# Patient Record
Sex: Female | Born: 1990 | Race: Black or African American | Hispanic: No | Marital: Single | Smoking: Former smoker
Health system: Southern US, Community
[De-identification: ages and names within clinical notes are randomized; demographics above are authoritative.]

## PROBLEM LIST (undated history)

## (undated) ENCOUNTER — Inpatient Hospital Stay (HOSPITAL_COMMUNITY): Payer: Self-pay

## (undated) DIAGNOSIS — L309 Dermatitis, unspecified: Secondary | ICD-10-CM

## (undated) HISTORY — PX: NO PAST SURGERIES: SHX2092

---
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
## 2013-03-26 ENCOUNTER — Emergency Department (HOSPITAL_BASED_OUTPATIENT_CLINIC_OR_DEPARTMENT_OTHER): Payer: 59

## 2013-03-26 ENCOUNTER — Emergency Department (HOSPITAL_BASED_OUTPATIENT_CLINIC_OR_DEPARTMENT_OTHER)
Admission: EM | Admit: 2013-03-26 | Discharge: 2013-03-27 | Disposition: A | Discharge status: Home / Self Care | Payer: 59 | Attending: Emergency Medicine | Admitting: Emergency Medicine

## 2013-03-26 ENCOUNTER — Other Ambulatory Visit: Payer: Self-pay

## 2013-03-26 ENCOUNTER — Encounter (HOSPITAL_BASED_OUTPATIENT_CLINIC_OR_DEPARTMENT_OTHER): Payer: Self-pay | Admitting: *Deleted

## 2013-03-26 DIAGNOSIS — E876 Hypokalemia: Secondary | ICD-10-CM | POA: Insufficient documentation

## 2013-03-26 DIAGNOSIS — R000 Tachycardia, unspecified: Secondary | ICD-10-CM | POA: Insufficient documentation

## 2013-03-26 DIAGNOSIS — R0602 Shortness of breath: Secondary | ICD-10-CM | POA: Insufficient documentation

## 2013-03-26 DIAGNOSIS — J40 Bronchitis, not specified as acute or chronic: Secondary | ICD-10-CM

## 2013-03-26 DIAGNOSIS — E860 Dehydration: Secondary | ICD-10-CM | POA: Insufficient documentation

## 2013-03-26 DIAGNOSIS — J3489 Other specified disorders of nose and nasal sinuses: Secondary | ICD-10-CM | POA: Insufficient documentation

## 2013-03-26 DIAGNOSIS — Z3202 Encounter for pregnancy test, result negative: Secondary | ICD-10-CM | POA: Insufficient documentation

## 2013-03-26 DIAGNOSIS — J209 Acute bronchitis, unspecified: Secondary | ICD-10-CM | POA: Insufficient documentation

## 2013-03-26 DIAGNOSIS — R509 Fever, unspecified: Secondary | ICD-10-CM | POA: Insufficient documentation

## 2013-03-26 LAB — URINALYSIS, ROUTINE W REFLEX MICROSCOPIC
Bilirubin Urine: NEGATIVE
Ketones, ur: 15 mg/dL — AB
Nitrite: POSITIVE — AB
Protein, ur: NEGATIVE mg/dL
Urobilinogen, UA: 0.2 mg/dL (ref 0.0–1.0)
pH: 5.5 (ref 5.0–8.0)

## 2013-03-26 LAB — CBC WITH DIFFERENTIAL/PLATELET
Basophils Absolute: 0 10*3/uL (ref 0.0–0.1)
Basophils Relative: 0 % (ref 0–1)
Eosinophils Absolute: 0.1 10*3/uL (ref 0.0–0.7)
HCT: 32.6 % — ABNORMAL LOW (ref 36.0–46.0)
MCH: 29.3 pg (ref 26.0–34.0)
MCHC: 33.1 g/dL (ref 30.0–36.0)
Monocytes Absolute: 0.4 10*3/uL (ref 0.1–1.0)
Neutro Abs: 15 10*3/uL — ABNORMAL HIGH (ref 1.7–7.7)
Neutrophils Relative %: 89 % — ABNORMAL HIGH (ref 43–77)
RDW: 13.1 % (ref 11.5–15.5)

## 2013-03-26 LAB — BASIC METABOLIC PANEL
BUN: 12 mg/dL (ref 6–23)
Chloride: 103 mEq/L (ref 96–112)
Creatinine, Ser: 0.8 mg/dL (ref 0.50–1.10)
GFR calc Af Amer: >90 mL/min (ref >90)
GFR calc non Af Amer: >90 mL/min (ref >90)

## 2013-03-26 LAB — PREGNANCY, URINE: Preg Test, Ur: NEGATIVE

## 2013-03-26 LAB — URINE MICROSCOPIC-ADD ON

## 2013-03-26 MED ORDER — DEXTROSE 5 % IV SOLN
500.0000 mg | Freq: Once | INTRAVENOUS | Status: AC
  Administered 2013-03-26: 500 mg via INTRAVENOUS
  Filled 2013-03-26 (×2): qty 500

## 2013-03-26 MED ORDER — AZITHROMYCIN 250 MG PO TABS: 250.0000 mg | ORAL_TABLET | Freq: Every day | ORAL | Status: DC

## 2013-03-26 MED ORDER — PREDNISONE 50 MG PO TABS
60.0000 mg | ORAL_TABLET | Freq: Once | ORAL | Status: AC
  Administered 2013-03-26: 60 mg via ORAL
  Filled 2013-03-26: qty 1

## 2013-03-26 MED ORDER — ALBUTEROL SULFATE (5 MG/ML) 0.5% IN NEBU: 5.0000 mg | INHALATION_SOLUTION | Freq: Once | RESPIRATORY_TRACT | Status: AC

## 2013-03-26 MED ORDER — IPRATROPIUM BROMIDE 0.02 % IN SOLN
RESPIRATORY_TRACT | Status: AC
  Administered 2013-03-26: 0.5 mg via RESPIRATORY_TRACT
  Filled 2013-03-26: qty 2.5

## 2013-03-26 MED ORDER — ACETAMINOPHEN 325 MG PO TABS
650.0000 mg | ORAL_TABLET | Freq: Once | ORAL | Status: AC
  Administered 2013-03-26: 650 mg via ORAL
  Filled 2013-03-26: qty 2

## 2013-03-26 MED ORDER — ALBUTEROL SULFATE HFA 108 (90 BASE) MCG/ACT IN AERS
2.0000 | INHALATION_SPRAY | RESPIRATORY_TRACT | Status: DC | PRN
  Administered 2013-03-26: 2 via RESPIRATORY_TRACT
  Filled 2013-03-26: qty 6.7

## 2013-03-26 MED ORDER — SODIUM CHLORIDE 0.9 % IV BOLUS (SEPSIS)
1000.0000 mL | Freq: Once | INTRAVENOUS | Status: AC
  Administered 2013-03-26: 1000 mL via INTRAVENOUS

## 2013-03-26 MED ORDER — ALBUTEROL SULFATE (5 MG/ML) 0.5% IN NEBU
INHALATION_SOLUTION | RESPIRATORY_TRACT | Status: AC
  Administered 2013-03-26: 5 mg via RESPIRATORY_TRACT
  Filled 2013-03-26: qty 1

## 2013-03-26 MED ORDER — ALBUTEROL SULFATE (5 MG/ML) 0.5% IN NEBU
INHALATION_SOLUTION | RESPIRATORY_TRACT | Status: AC
  Filled 2013-03-26: qty 2

## 2013-03-26 MED ORDER — ALBUTEROL (5 MG/ML) CONTINUOUS INHALATION SOLN
10.0000 mg/h | INHALATION_SOLUTION | RESPIRATORY_TRACT | Status: DC
  Administered 2013-03-26: 10 mg/h via RESPIRATORY_TRACT
  Filled 2013-03-26: qty 0.5
  Filled 2013-03-26: qty 20

## 2013-03-26 MED ORDER — IPRATROPIUM BROMIDE 0.02 % IN SOLN: 0.5000 mg | Freq: Once | RESPIRATORY_TRACT | Status: AC

## 2013-03-26 MED ORDER — HYDROCOD POLST-CHLORPHEN POLST 10-8 MG/5ML PO LQCR: 5.0000 mL | Freq: Two times a day (BID) | ORAL | Status: DC | PRN

## 2013-03-26 MED ORDER — ONDANSETRON 8 MG PO TBDP
8.0000 mg | ORAL_TABLET | Freq: Once | ORAL | Status: AC
  Administered 2013-03-26: 8 mg via ORAL
  Filled 2013-03-26: qty 1

## 2013-03-26 MED ORDER — POTASSIUM CHLORIDE CRYS ER 20 MEQ PO TBCR
40.0000 meq | EXTENDED_RELEASE_TABLET | Freq: Once | ORAL | Status: AC
  Administered 2013-03-26: 40 meq via ORAL
  Filled 2013-03-26: qty 2

## 2013-03-26 MED ORDER — PREDNISONE 20 MG PO TABS: 60.0000 mg | ORAL_TABLET | Freq: Every day | ORAL | Status: DC

## 2013-03-26 MED ORDER — POTASSIUM CHLORIDE 10 MEQ/100ML IV SOLN
10.0000 meq | INTRAVENOUS | Status: AC
  Administered 2013-03-26 – 2013-03-27 (×2): 10 meq via INTRAVENOUS
  Filled 2013-03-26 (×2): qty 100

## 2013-03-26 NOTE — ED Notes (Signed)
Pt states she has had a cough x 1 week. Hurts to take deep breath. BBS-diminished

## 2013-03-26 NOTE — ED Notes (Signed)
Patient transported to X-ray 

## 2013-03-26 NOTE — ED Notes (Signed)
RT Note: Patient was started on a continuous nebulizer with Albuterol 10 mg per MD order. Nebulizer will run over an hour. Patient is currently tolerating it well. Rt will continue to monitor.

## 2013-03-26 NOTE — ED Provider Notes (Signed)
History    This chart was scribed for Symeon Puleo B. Bernette Mayers, MD by Quintella Reichert, ED scribe.  This patient was seen in room MH09/MH09 and the patient's care was started at 6:29 PM.   CSN: [id number]  Arrival date & time 03/26/13  1742      Chief Complaint  Patient presents with  . Cough     The history is provided by the patient. No language interpreter was used.    HPI Comments: Stephanie Sosa is a 22 y.o. female who presents to the Emergency Department complaining of moderate cough that began 1 week ago.  Pt states that cough is intermittently productive and is exacerbated by deep breathing.  She also reports rhinorrhea, fever (100.3 F on admission), and SOB secondary to pain.  Pt denies nausea, emesis, urinary symptoms, diarrhea, or any other associated symptoms.  She denies known allergies or recent sick contact.  She denies h/o similar symptoms.  Pt smokes marijuana but denies cigarette use. Pt denies h/o any serious medical conditions.   History reviewed. No pertinent past medical history.  History reviewed. No pertinent past surgical history.  History reviewed. No pertinent family history.  History  Substance Use Topics  . Smoking status: Never Smoker   . Smokeless tobacco: Not on file  . Alcohol Use: No    OB History   Grav Para Term Preterm Abortions TAB SAB Ect Mult Living                  Review of Systems A complete 10 system review of systems was obtained and all systems are negative except as noted in the HPI and PMH.    Allergies  Review of patient's allergies indicates no known allergies.  Home Medications  No current outpatient prescriptions on file.  BP 156/107  Pulse 122  Temp(Src) 100.3 F (37.9 C) (Oral)  Resp 20  Ht 5' 4.5" (1.638 m)  Wt 154 lb (69.854 kg)  BMI 26.04 kg/m2  SpO2 96%  LMP 03/26/2013  Physical Exam  Nursing note and vitals reviewed. Constitutional: She is oriented to person, place, and time. She appears  well-developed and well-nourished.  HENT:  Head: Normocephalic and atraumatic.  Eyes: EOM are normal. Pupils are equal, round, and reactive to light.  Neck: Normal range of motion. Neck supple.  Cardiovascular: Normal heart sounds and intact distal pulses.   No murmur heard. Tachycardia  Pulmonary/Chest: Effort normal. No respiratory distress. She has wheezes (Mild end expiratory wheeze). She has no rales. She exhibits no tenderness.  Increased work of breathing  Abdominal: Bowel sounds are normal. She exhibits no distension. There is no tenderness.  Musculoskeletal: Normal range of motion. She exhibits no edema and no tenderness.  Neurological: She is alert and oriented to person, place, and time. She has normal strength. No cranial nerve deficit or sensory deficit.  Skin: Skin is warm and dry. No rash noted.  Psychiatric: She has a normal mood and affect.    ED Course  Procedures (including critical care time)  DIAGNOSTIC STUDIES: Oxygen Saturation is 96% on room air, normal by my interpretation.    COORDINATION OF CARE: 6:32 PM-Discussed treatment plan which includes imaging, urinalysis and Tylenol with pt at bedside and pt agreed to plan.      Labs Reviewed  CBC WITH DIFFERENTIAL - Abnormal; Notable for the following:    WBC 16.8 (*)    RBC 3.69 (*)    Hemoglobin 10.8 (*)    HCT 32.6 (*)  Platelets 140 (*)    Neutrophils Relative % 89 (*)    Neutro Abs 15.0 (*)    Lymphocytes Relative 8 (*)    Monocytes Relative 2 (*)    All other components within normal limits  BASIC METABOLIC PANEL - Abnormal; Notable for the following:    Potassium 2.5 (*)    Glucose, Bld 161 (*)    All other components within normal limits  URINALYSIS, ROUTINE W REFLEX MICROSCOPIC - Abnormal; Notable for the following:    APPearance CLOUDY (*)    Hgb urine dipstick LARGE (*)    Ketones, ur 15 (*)    Nitrite POSITIVE (*)    Leukocytes, UA SMALL (*)    All other components within normal  limits  URINE MICROSCOPIC-ADD ON - Abnormal; Notable for the following:    Squamous Epithelial / LPF FEW (*)    Bacteria, UA MANY (*)    All other components within normal limits  URINE CULTURE  PREGNANCY, URINE   Dg Chest 2 View  03/26/2013   *RADIOLOGY REPORT*  Clinical Data: Cough and upper chest pain. Fever.  CHEST - 2 VIEW  Comparison: 12/09/2010  Findings: There is slight peribronchial thickening consistent with bronchitis.  Lungs are otherwise clear.  No osseous abnormality. Heart size and vascularity are normal.  IMPRESSION: Bronchitic changes.   Original Report Authenticated By: Francene Boyers, M.D.     1. Bronchitis   2. Tachycardia   3. Hypokalemia   4. Dehydration       MDM  CXR negative for PNA. Pt with wheezing, fever, tachycardia. Minimal improvement with neb, started CAT, prednisone. Sleeping comfortably now.    10:00 PM Pt remained tachycardic after temp improved and neb worn off. No hypoxia with ambulating but tachycardia worsened. She has not been hypotensive in the ED, but due to tachycardia given IVF bolus and checked labs. HR improving with fluids, she has leukocytosis and mild anemia as well as hypokalemia. Will need repletion and likely an additional fluid bolus. She was given Zithromax for possible atypical pneumonia. Will reassess.    Date: 03/26/2013  Rate: 114  Rhythm: sinus tachycardia  QRS Axis: normal  Intervals: normal  ST/T Wave abnormalities: nonspecific T wave changes  Conduction Disutrbances:none  Narrative Interpretation:   Old EKG Reviewed: none available   12:05 AM Pt feeling much better, getting second bolus of NS and K, care signed out to Dr. Read Drivers for reassessment and ultimate disposition.   I personally performed the services described in this documentation, which was scribed in my presence. The recorded information has been reviewed and is accurate.     Jacole Capley B. Bernette Mayers, MD 03/27/13 0005

## 2013-03-26 NOTE — ED Notes (Signed)
RT at beside to admin breathing tx

## 2013-03-26 NOTE — ED Notes (Signed)
Ambulated patient. While ambulating, patient's pulse ox remained stable, but heart rate increased. MD notified.

## 2013-03-27 MED ORDER — POTASSIUM CHLORIDE CRYS ER 20 MEQ PO TBCR: 20.0000 meq | EXTENDED_RELEASE_TABLET | Freq: Every day | ORAL | Status: DC

## 2013-03-27 MED ORDER — SODIUM CHLORIDE 0.9 % IV SOLN
Freq: Once | INTRAVENOUS | Status: AC
  Administered 2013-03-27: via INTRAVENOUS

## 2013-03-27 NOTE — ED Notes (Signed)
Pt c/o nausea. Will update MD.  

## 2013-03-27 NOTE — ED Provider Notes (Signed)
1:56 AM SBP 120, HR 95 after IV fluids. Patient feeling much better.   Hanley Seamen, MD 03/27/13 0157

## 2013-03-27 NOTE — ED Notes (Signed)
Pt denies any further nausea at present. Instructed to call if this changes.

## 2013-03-27 NOTE — ED Notes (Signed)
Called to room. Pt states that medication is "burning" . Rate decreased to 75 ml/hr.

## 2013-03-27 NOTE — ED Notes (Signed)
Dr. Read Drivers aware pt is having nausea.

## 2013-03-27 NOTE — ED Notes (Signed)
Placed on cardiac monitor 

## 2013-03-27 NOTE — Progress Notes (Signed)
Albuterol MDI instruction done with patient.  Two puffs given via spacer.  Dosing frequency reviewed with patient.  Patient with good understanding of dosing and use of MDI, with return demonstration of proper technique.  Able to use on own.

## 2013-03-30 LAB — URINE CULTURE: Colony Count: >=100000

## 2013-03-31 ENCOUNTER — Telehealth (HOSPITAL_COMMUNITY): Payer: Self-pay | Admitting: Emergency Medicine

## 2013-03-31 NOTE — ED Notes (Signed)
Post ED Visit - Positive Culture Follow-up  Culture report reviewed by antimicrobial stewardship pharmacist: []  Wes Dulaney, Pharm.D., BCPS [x]  Celedonio Miyamoto, [address].D., BCPS []  Georgina Pillion, [address].D., BCPS []  [place], [address].D., BCPS, AAHIVP []  Estella Husk, Pharm.D., BCPS, AAHIV  Positive urine culture No treatment needed.  Stephanie Sosa 03/31/2013, 5:38 PM

## 2013-10-25 ENCOUNTER — Encounter (HOSPITAL_COMMUNITY): Payer: Self-pay | Admitting: Emergency Medicine

## 2013-10-25 ENCOUNTER — Emergency Department (HOSPITAL_COMMUNITY)
Admission: EM | Admit: 2013-10-25 | Discharge: 2013-10-26 | Disposition: A | Discharge status: Home / Self Care | Payer: 59 | Attending: Emergency Medicine | Admitting: Emergency Medicine

## 2013-10-25 DIAGNOSIS — N751 Abscess of Bartholin's gland: Secondary | ICD-10-CM

## 2013-10-25 DIAGNOSIS — R000 Tachycardia, unspecified: Secondary | ICD-10-CM | POA: Insufficient documentation

## 2013-10-25 DIAGNOSIS — Z3202 Encounter for pregnancy test, result negative: Secondary | ICD-10-CM | POA: Insufficient documentation

## 2013-10-25 DIAGNOSIS — F411 Generalized anxiety disorder: Secondary | ICD-10-CM | POA: Insufficient documentation

## 2013-10-25 DIAGNOSIS — IMO0002 Reserved for concepts with insufficient information to code with codable children: Secondary | ICD-10-CM | POA: Insufficient documentation

## 2013-10-25 HISTORY — DX: Dermatitis, unspecified: L30.9

## 2013-10-25 MED ORDER — OXYCODONE-ACETAMINOPHEN 5-325 MG PO TABS
2.0000 | ORAL_TABLET | Freq: Once | ORAL | Status: AC
  Administered 2013-10-25: 2 via ORAL
  Filled 2013-10-25: qty 2

## 2013-10-25 NOTE — ED Notes (Signed)
Pt c/o R labia swelling and pain. Denies drainage.

## 2013-10-26 ENCOUNTER — Encounter (HOSPITAL_COMMUNITY): Payer: Self-pay

## 2013-10-26 ENCOUNTER — Emergency Department (HOSPITAL_COMMUNITY): Payer: 59

## 2013-10-26 LAB — BASIC METABOLIC PANEL
CO2: 24 mEq/L (ref 19–32)
Chloride: 104 mEq/L (ref 96–112)
Creatinine, Ser: 0.79 mg/dL (ref 0.50–1.10)
GFR calc Af Amer: >90 mL/min (ref >90)
Glucose, Bld: 106 mg/dL — ABNORMAL HIGH (ref 70–99)
Sodium: 139 mEq/L (ref 135–145)

## 2013-10-26 LAB — CBC WITH DIFFERENTIAL/PLATELET
Basophils Relative: 0 % (ref 0–1)
Eosinophils Absolute: 0.1 10*3/uL (ref 0.0–0.7)
Eosinophils Relative: 1 % (ref 0–5)
Lymphs Abs: 1.7 10*3/uL (ref 0.7–4.0)
MCH: 28.5 pg (ref 26.0–34.0)
MCHC: 32.2 g/dL (ref 30.0–36.0)
MCV: 88.5 fL (ref 78.0–100.0)
Neutrophils Relative %: 77 % (ref 43–77)
Platelets: 159 10*3/uL (ref 150–400)
RDW: 12.8 % (ref 11.5–15.5)

## 2013-10-26 MED ORDER — IOHEXOL 300 MG/ML  SOLN
100.0000 mL | Freq: Once | INTRAMUSCULAR | Status: AC | PRN
  Administered 2013-10-26: 100 mL via INTRAVENOUS

## 2013-10-26 MED ORDER — MORPHINE SULFATE 4 MG/ML IJ SOLN
4.0000 mg | Freq: Once | INTRAMUSCULAR | Status: AC
  Administered 2013-10-26: 4 mg via INTRAVENOUS
  Filled 2013-10-26: qty 1

## 2013-10-26 MED ORDER — OXYCODONE-ACETAMINOPHEN 5-325 MG PO TABS
2.0000 | ORAL_TABLET | Freq: Once | ORAL | Status: AC
  Administered 2013-10-26: 2 via ORAL
  Filled 2013-10-26: qty 2

## 2013-10-26 MED ORDER — OXYCODONE-ACETAMINOPHEN 5-325 MG PO TABS: 1.0000 | ORAL_TABLET | ORAL | Status: DC | PRN

## 2013-10-26 NOTE — ED Provider Notes (Signed)
Medical screening examination/treatment/procedure(s) were conducted as a shared visit with non-physician practitioner(s) and myself.  I personally evaluated the patient during the encounter.  EKG Interpretation   None      I&D abscess assistance with procedure: Bedside ultrasound used to localize fluid collection for incision and drainage; physician's assistant initiated procedure and I assisted with scalpel to complete incision and drainage with copious pus expressed; patient tolerated procedure well with no apparent immediate complications, see procedure note by physician's assistant  Hurman Horn, MD 10/27/13 2250

## 2013-10-26 NOTE — ED Provider Notes (Signed)
CSN: [id number]     Arrival date & time 10/25/13  2058 History   First MD Initiated Contact with Patient 10/25/13 2302     Chief Complaint  Patient presents with  . Groin Swelling   (Consider location/radiation/quality/duration/timing/severity/associated sxs/prior Treatment) The history is provided by the patient and medical records. No language interpreter was used.   Stephanie Sosa is a 22 y.o. female  with a hx of eczema presents to the Emergency Department complaining of gradual, persistent, progressively worsening swelling of the right groin onset 3 day ago 2 day after shaving with a straight razer. Associated symptoms include pain at the site.  Nothing makes it better and walking, palpation and makes it worse.  Pt denies fever, chills, headache, neck pain, chest pain, abd pain, N/V/D, vaginal bleeding, vaginal discharge.     Past Medical History  Diagnosis Date  . Eczema    History reviewed. No pertinent past surgical history. No family history on file. History  Substance Use Topics  . Smoking status: Never Smoker   . Smokeless tobacco: Not on file  . Alcohol Use: No   OB History   Grav Para Term Preterm Abortions TAB SAB Ect Mult Living                 Review of Systems  Constitutional: Negative for fever and chills.  Gastrointestinal: Negative for nausea and vomiting.  Skin: Positive for rash.  Allergic/Immunologic: Negative for immunocompromised state.  Hematological: Does not bruise/bleed easily.  Psychiatric/Behavioral: The patient is not nervous/anxious.     Allergies  Review of patient's allergies indicates no known allergies.  Home Medications   Current Outpatient Rx  Name  Route  Sig  Dispense  Refill  . triamcinolone cream (KENALOG) 0.1 %   Topical   Apply 1 application topically 2 (two) times daily.          BP 138/91  Pulse 99  Temp(Src) 98.5 F (36.9 C) (Oral)  Resp 16  Ht 5\' 4"  (1.626 m)  Wt 148 lb (67.132 kg)  BMI 25.39 kg/m2  SpO2  98%  LMP 10/14/2013 Physical Exam  Nursing note and vitals reviewed. Constitutional: She is oriented to person, place, and time. She appears well-developed and well-nourished. No distress.  HENT:  Head: Normocephalic and atraumatic.  Eyes: Conjunctivae are normal. No scleral icterus.  Neck: Normal range of motion.  Cardiovascular: Regular rhythm, normal heart sounds and intact distal pulses.   tachycardia  Pulmonary/Chest: Effort normal and breath sounds normal.  Abdominal: Soft. Bowel sounds are normal. She exhibits no distension. There is no tenderness. Hernia confirmed negative in the right inguinal area and confirmed negative in the left inguinal area.  Soft and nontender  Genitourinary:    There is no rash, tenderness, lesion or injury on the right labia. There is no rash, tenderness, lesion or injury on the left labia. No erythema, tenderness or bleeding around the vagina. No foreign body around the vagina. No signs of injury around the vagina. No vaginal discharge found.  No evidence of a bartholin gland cyst  Lymphadenopathy:    She has no cervical adenopathy.       Right: No inguinal adenopathy present.       Left: No inguinal adenopathy present.  Neurological: She is alert and oriented to person, place, and time.  Skin: Skin is warm and dry. She is not diaphoretic. There is erythema.  Psychiatric: Her mood appears anxious.  Pt is tearful and anxious  ED Course  Procedures (including critical care time) Labs Review Labs Reviewed  CBC WITH DIFFERENTIAL  BASIC METABOLIC PANEL  POCT PREGNANCY, URINE   Imaging Review No results found.  EKG Interpretation   None      EMERGENCY DEPARTMENT US SOFT TISSUE INTERPRETATION "Study: Limited Ultrasound of the noted body part in comments below"  INDICATIONS: Pain and Soft tissue infection Multiple views of the body part are obtained with a multi-frequency linear probe  PERFORMED BY:  Myself  IMAGES ARCHIVED?:  Yes  SIDE:Right   BODY PART: right lower labia/buttock  FINDINGS: Abcess present  LIMITATIONS:  none  INTERPRETATION:  Abcess present  COMMENT:  Large, deep fluid collection  MDM   1. Perianal abscess      Stephanie Sosa presents with "lump in groin."  On exam, palpable fluctuance below the surface with only minimal induration and very little erythema.  I evaluated the area with an ultrasound which shows a large collection of fluid, likely to be an abscess.  Will plan for I&D.    1:21 AM On repeat exam induration tracts all the way to the perineum and his perirectal.  I do not feel comfortable with I&D in this area without further imaging. CBC and BMP pending. Patient pain control at this time.  Urine pregnancy test negative. Will obtain CT pelvis with contrast.  Discussed with Antony Madura, PA-C who will assume care.  Stephanie Client Paytyn Mesta, PA-C 10/26/13 [phone]

## 2013-10-26 NOTE — ED Provider Notes (Signed)
Patient care assumed from [facility], PA-C at shift change. Patient presenting today with worsening R groin pain x 3 days. CT pending to evaluate for perirectal abscess.  CT negative for perirectal abscess. Findings are significant for 2.6cm bartholin's abscess. Morphine ordered for pain control. Plan to I&D today.  I&D at beside with copious amounts of purulent drainage. Patient tolerated procedure well. Patient stable for d/c with OBGYN follow up for recheck in 48 hours. Return precautions discussed and patient agreeable to plan with no unaddressed concerns.   INCISION AND DRAINAGE Performed by: Antony Madura Consent: Verbal consent obtained. Risks and benefits: risks, benefits and alternatives were discussed Type: abscess  Body area: R genital/bartholin's gland  Anesthesia: local infiltration  Incision was made with a scalpel.  Local anesthetic: lidocaine 2% with epinephrine  Anesthetic total: 8 ml  Complexity: complex Blunt dissection to break up loculations  Drainage: purulent and bloody  Drainage amount: copious  Packing material: none  Patient tolerance: Patient tolerated the procedure well with no immediate complications.   Results for orders placed during the hospital encounter of 10/25/13  CBC WITH DIFFERENTIAL      Result Value Range   WBC 11.7 (*) 4.0 - 10.5 K/uL   RBC 3.82 (*) 3.87 - 5.11 MIL/uL   Hemoglobin 10.9 (*) 12.0 - 15.0 g/dL   HCT 16.1 (*) 09.6 - 04.5 %   MCV 88.5  78.0 - 100.0 fL   MCH 28.5  26.0 - 34.0 pg   MCHC 32.2  30.0 - 36.0 g/dL   RDW 40.9  81.1 - 91.4 %   Platelets 159  150 - 400 K/uL   Neutrophils Relative % 77  43 - 77 %   Neutro Abs 9.0 (*) 1.7 - 7.7 K/uL   Lymphocytes Relative 15  12 - 46 %   Lymphs Abs 1.7  0.7 - 4.0 K/uL   Monocytes Relative 8  3 - 12 %   Monocytes Absolute 0.9  0.1 - 1.0 K/uL   Eosinophils Relative 1  0 - 5 %   Eosinophils Absolute 0.1  0.0 - 0.7 K/uL   Basophils Relative 0  0 - 1 %   Basophils  Absolute 0.0  0.0 - 0.1 K/uL  BASIC METABOLIC PANEL      Result Value Range   Sodium 139  135 - 145 mEq/L   Potassium 3.5  3.5 - 5.1 mEq/L   Chloride 104  96 - 112 mEq/L   CO2 24  19 - 32 mEq/L   Glucose, Bld 106 (*) 70 - 99 mg/dL   BUN 8  6 - 23 mg/dL   Creatinine, Ser 7.82  0.50 - 1.10 mg/dL   Calcium 9.1  8.4 - 95.6 mg/dL   GFR calc non Af Amer >90  >90 mL/min   GFR calc Af Amer >90  >90 mL/min  POCT PREGNANCY, URINE      Result Value Range   Preg Test, Ur NEGATIVE  NEGATIVE   Ct Pelvis W Contrast  10/26/2013   CLINICAL DATA:  Right groin and labial swelling  EXAM: CT PELVIS WITH CONTRAST  TECHNIQUE: Multidetector CT imaging of the pelvis was performed using the standard protocol following the bolus administration of intravenous contrast.  CONTRAST:  OMNIPAQUE IOHEXOL 300 MG/ML  SOLN  COMPARISON:  None.  FINDINGS: In the right vulvar region, there is a 2.7 cm rounded collection with avid peripheral enhancement. No involvement above the pelvic floor.  No acute intra-abdominal findings. Likely small  volume free pelvic fluid. A thin, tubular fluid-filled structure in the left adnexa region is favored to represent bowel over hydrosalpinx. No acute osseous findings.  IMPRESSION: 2.6 cm right Bartholin's cyst, sterility indeterminate.   Electronically Signed   By: Tiburcio Pea M.D.   On: 10/26/2013 02:35      Antony Madura, PA-C 10/26/13 0424

## 2013-11-04 ENCOUNTER — Encounter (HOSPITAL_COMMUNITY): Payer: Self-pay | Admitting: Emergency Medicine

## 2013-11-04 ENCOUNTER — Emergency Department (HOSPITAL_COMMUNITY)
Admission: EM | Admit: 2013-11-04 | Discharge: 2013-11-04 | Disposition: A | Discharge status: Home / Self Care | Payer: 59 | Attending: Emergency Medicine | Admitting: Emergency Medicine

## 2013-11-04 DIAGNOSIS — N764 Abscess of vulva: Secondary | ICD-10-CM | POA: Insufficient documentation

## 2013-11-04 DIAGNOSIS — Z79899 Other long term (current) drug therapy: Secondary | ICD-10-CM | POA: Insufficient documentation

## 2013-11-04 DIAGNOSIS — L259 Unspecified contact dermatitis, unspecified cause: Secondary | ICD-10-CM | POA: Insufficient documentation

## 2013-11-04 MED ORDER — HYDROCODONE-ACETAMINOPHEN 5-325 MG PO TABS: 1.0000 | ORAL_TABLET | ORAL | Status: DC | PRN

## 2013-11-04 MED ORDER — OXYCODONE-ACETAMINOPHEN 5-325 MG PO TABS
1.0000 | ORAL_TABLET | Freq: Once | ORAL | Status: AC
  Administered 2013-11-04: 1 via ORAL
  Filled 2013-11-04: qty 1

## 2013-11-04 MED ORDER — IBUPROFEN 800 MG PO TABS
800.0000 mg | ORAL_TABLET | Freq: Once | ORAL | Status: AC
  Administered 2013-11-04: 800 mg via ORAL
  Filled 2013-11-04: qty 1

## 2013-11-04 NOTE — ED Provider Notes (Signed)
CSN: [id number]     Arrival date & time 11/04/13  0941 History   First MD Initiated Contact with Patient 11/04/13 1120     Chief Complaint  Patient presents with  . Abscess    HPI  Patient reports recurrence of her right labial abscess.  She had incision and drainage performed in the emergency department approximately one week ago.  She's been on antibiotics and doing warm water soaks but states that initially it improved and now it is worsening again.  She does not have an OB/GYN.  No fevers or chills.  Her pain is moderate to severe in severity   Past Medical History  Diagnosis Date  . Eczema    History reviewed. No pertinent past surgical history. History reviewed. No pertinent family history. History  Substance Use Topics  . Smoking status: Never Smoker   . Smokeless tobacco: Not on file  . Alcohol Use: No   OB History   Grav Para Term Preterm Abortions TAB SAB Ect Mult Living                 Review of Systems  All other systems reviewed and are negative.    Allergies  Banana  Home Medications   Current Outpatient Rx  Name  Route  Sig  Dispense  Refill  . doxycycline (ADOXA) 100 MG tablet   Oral   Take 100 mg by mouth 2 (two) times daily.         Marland Kitchen oxyCODONE-acetaminophen (PERCOCET/ROXICET) 5-325 MG per tablet   Oral   Take 1 tablet by mouth every 4 (four) hours as needed for severe pain.   7 tablet   0   . triamcinolone cream (KENALOG) 0.1 %   Topical   Apply 1 application topically 2 (two) times daily.          BP 118/75  Pulse 112  Temp(Src) 98.2 F (36.8 C) (Oral)  Resp 20  SpO2 99%  LMP 10/14/2013 Physical Exam  Nursing note and vitals reviewed. Constitutional: She is oriented to person, place, and time. She appears well-developed and well-nourished. No distress.  HENT:  Head: Normocephalic and atraumatic.  Eyes: EOM are normal.  Neck: Normal range of motion.  Cardiovascular: Normal rate, regular rhythm and normal heart sounds.    Pulmonary/Chest: Effort normal and breath sounds normal.  Abdominal: Soft. She exhibits no distension. There is no tenderness.  Genitourinary:  Large abscess of the right labia minora likely Bartholin ds gland abscess.  Musculoskeletal: Normal range of motion.  Neurological: She is alert and oriented to person, place, and time.  Skin: Skin is warm and dry.  Psychiatric: She has a normal mood and affect. Judgment normal.    ED Course  Procedures (including critical care time)  INCISION AND DRAINAGE Performed by: Lyanne Co Consent: Verbal consent obtained. Risks and benefits: risks, benefits and alternatives were discussed Time out performed prior to procedure Type: abscess Body area: right inferior labia minora Anesthesia: local infiltration Incision was made with a scalpel. Local anesthetic: lidocaine 2% with epinephrine Anesthetic total: 7 ml Complexity: complex Blunt dissection to break up loculations Drainage: purulent Drainage amount: large Packing material: none Patient tolerance: Patient tolerated the procedure well with no immediate complications.     Labs Review Labs Reviewed - No data to display Imaging Review No results found.  EKG Interpretation   None       MDM   1. Labial abscess    Large right labial abscess likely Bartholin's  gland abscess.  Patient will continue her doxycycline.  Continue warm water soaks.  Return to the maternity assessment unit at [facility] hospital for worsening symptoms.  The patient barely tolerated the procedure likely secondary to anxiety and pain. I felt as though an appropriate incision and drainage was performed and I do not think that she needs additional care at this time.  Have instructed her that if things were to worsen she would likely need to be taken to the operating room and placed under general anesthesia given her severe reaction to pain and her level of anxiety    Lyanne Co, MD 11/04/13 1240

## 2013-11-04 NOTE — ED Notes (Signed)
Pt here week ago last Thursday for vaginal abscess, abscess was drained, now has closed and fluid collected again, states labia swollen and painful

## 2013-11-04 NOTE — ED Notes (Signed)
Patient was educated not to drive, operate heavy machinery, or drink alcohol while taking narcotic medication.  

## 2013-11-04 NOTE — ED Notes (Signed)
Bed: WA12 Expected date:  Expected time:  Means of arrival:  Comments: 

## 2015-04-06 ENCOUNTER — Emergency Department (HOSPITAL_COMMUNITY)
Admission: EM | Admit: 2015-04-06 | Discharge: 2015-04-06 | Disposition: A | Discharge status: Home / Self Care | Payer: 59 | Attending: Emergency Medicine | Admitting: Emergency Medicine

## 2015-04-06 ENCOUNTER — Encounter (HOSPITAL_COMMUNITY): Payer: Self-pay | Admitting: Emergency Medicine

## 2015-04-06 DIAGNOSIS — H18821 Corneal disorder due to contact lens, right eye: Secondary | ICD-10-CM

## 2015-04-06 DIAGNOSIS — Z872 Personal history of diseases of the skin and subcutaneous tissue: Secondary | ICD-10-CM | POA: Diagnosis not present

## 2015-04-06 DIAGNOSIS — Z79899 Other long term (current) drug therapy: Secondary | ICD-10-CM | POA: Diagnosis not present

## 2015-04-06 DIAGNOSIS — H578 Other specified disorders of eye and adnexa: Secondary | ICD-10-CM | POA: Diagnosis present

## 2015-04-06 MED ORDER — FLUORESCEIN SODIUM 1 MG OP STRP
2.0000 | ORAL_STRIP | Freq: Once | OPHTHALMIC | Status: DC
  Filled 2015-04-06: qty 2

## 2015-04-06 MED ORDER — CIPROFLOXACIN HCL 0.3 % OP SOLN: 1.0000 [drp] | OPHTHALMIC | Status: DC

## 2015-04-06 MED ORDER — TETRACAINE HCL 0.5 % OP SOLN
2.0000 [drp] | Freq: Once | OPHTHALMIC | Status: DC
  Filled 2015-04-06: qty 2

## 2015-04-06 NOTE — ED Provider Notes (Signed)
CSN: [id number]     Arrival date & time 04/06/15  2043 History   First MD Initiated Contact with Patient 04/06/15 2145     Chief Complaint  Patient presents with  . Eye Irritation      (Consider location/radiation/quality/duration/timing/severity/associated sxs/prior Treatment) The history is provided by the patient and medical records. No language interpreter was used.     Stephanie Sosa is a 24 y.o. female  with a hx of eczema presents to the Emergency Department complaining of gradual, persistent, progressively worsening right eye redness and feeling of foreign body onset 3 days ago.  Pt reports she wears monthly contacts and changed them 1 week ago.  Pt reports she has used saline eye drops without relief.  She denies watery eye. Associated symptoms include irritation of the right eye.  She denies purulent discharge or matting of the eye in the AM.  Saline eye drops makes it better and wearing her contact lenses makes it worse.  Pt denies fever, chills, headache, visual acuity, neck pain, photophobia, purulent drainage.       Past Medical History  Diagnosis Date  . Eczema    History reviewed. No pertinent past surgical history. No family history on file. History  Substance Use Topics  . Smoking status: Never Smoker   . Smokeless tobacco: Not on file  . Alcohol Use: No   OB History    No data available     Review of Systems  Constitutional: Negative for fever, chills and fatigue.  HENT: Negative for congestion, ear discharge, ear pain, mouth sores, postnasal drip, rhinorrhea, sinus pressure and sore throat.   Eyes: Positive for redness. Negative for visual disturbance.  Respiratory: Negative for cough.   Cardiovascular: Negative for chest pain, palpitations and leg swelling.  Gastrointestinal: Negative for nausea and vomiting.  Musculoskeletal: Negative for arthralgias and neck stiffness.  Skin: Negative for rash.  Neurological: Negative for syncope, light-headedness and  headaches.  Hematological: Negative for adenopathy.  All other systems reviewed and are negative.     Allergies  Banana  Home Medications   Prior to Admission medications   Medication Sig Start Date End Date Taking? Authorizing Provider  acetaminophen (TYLENOL) 500 MG tablet Take 1,000 mg by mouth every 6 (six) hours as needed for mild pain or moderate pain.   Yes Historical Provider, MD  ibuprofen (ADVIL,MOTRIN) 200 MG tablet Take 400 mg by mouth every 6 (six) hours as needed for mild pain, moderate pain or cramping.   Yes Historical Provider, MD  naproxen sodium (ANAPROX) 220 MG tablet Take 440 mg by mouth every 12 (twelve) hours as needed (cramping).   Yes Historical Provider, MD  ciprofloxacin (CILOXAN) 0.3 % ophthalmic solution Place 1 drop into both eyes every 2 (two) hours. Administer 1 drop, every 2 hours, while awake, for 2 days. Then 1 drop, every 4 hours, while awake, for the next 5 days. 04/06/15   Tzvi Economou, PA-C  HYDROcodone-acetaminophen (NORCO/VICODIN) 5-325 MG per tablet Take 1 tablet by mouth every 4 (four) hours as needed for moderate pain. Patient not taking: Reported on 04/06/2015 11/04/13   Azalia BilisKevin Campos, MD  oxyCODONE-acetaminophen (PERCOCET/ROXICET) 5-325 MG per tablet Take 1 tablet by mouth every 4 (four) hours as needed for severe pain. Patient not taking: Reported on 04/06/2015 10/26/13   Antony MaduraKelly Humes, PA-C   BP 143/92 mmHg  Pulse 96  Temp(Src)   Resp 20  SpO2 100% Physical Exam  Constitutional: She is oriented to person, place, and time.  She appears well-developed and well-nourished. No distress.  HENT:  Head: Normocephalic and atraumatic.  Nose: Nose normal. No mucosal edema or rhinorrhea.  Mouth/Throat: Uvula is midline, oropharynx is clear and moist and mucous membranes are normal. No uvula swelling. No oropharyngeal exudate, posterior oropharyngeal edema, posterior oropharyngeal erythema or tonsillar abscesses.  Eyes: Conjunctivae, EOM and lids  are normal. Pupils are equal, round, and reactive to light. Lids are everted and swept, no foreign bodies found. Right eye exhibits no chemosis, no discharge and no exudate. No foreign body present in the right eye. Left eye exhibits no chemosis, no discharge and no exudate. No foreign body present in the left eye. Right conjunctiva is not injected. Right conjunctiva has no hemorrhage. Left conjunctiva is not injected. Left conjunctiva has no hemorrhage.  Slit lamp exam:      The right eye shows no corneal abrasion, no corneal flare, no corneal ulcer, no foreign body, no fluorescein uptake and no anterior chamber bulge.       The left eye shows no corneal abrasion, no corneal flare, no corneal ulcer, no foreign body, no fluorescein uptake and no anterior chamber bulge.    Pupils equal round and reactive to light No vertical, horizontal or rotational nystagmus Corneal abrasion noted to the right eye ringing the iris where her contact lens would fall with fluorescein uptake No visible foreign body No corneal flare, ulcer or dendritic staining  No herpetic lesions to the face or around the eye  Visual Acuity (pt not wearing visual correction):  R Near: 20/100;  L Near: 20/100  Neck: Normal range of motion.  Full range of motion without pain No midline or paraspinal tenderness No nuchal rigidity; no meningeal signs  Cardiovascular: Normal rate, regular rhythm, normal heart sounds and intact distal pulses.   No murmur heard. Pulmonary/Chest: Effort normal. No respiratory distress.  Musculoskeletal: Normal range of motion.  Neurological: She is alert and oriented to person, place, and time.  Mental Status:  Alert, oriented, thought content appropriate. Speech fluent without evidence of aphasia. Able to follow 2 step commands without difficulty.   Cranial Nerves:  II:  Peripheral visual fields grossly normal, pupils equal, round, reactive to light III,IV, VI: ptosis not present, extra-ocular  motions intact bilaterally  V,VII: smile symmetric, facial light touch sensation equal VIII: hearing grossly normal bilaterally  IX,X: gag reflex present  XI: bilateral shoulder shrug equal and strong XII: midline tongue extension   Skin: Skin is warm and dry. She is not diaphoretic. No erythema.  Psychiatric: She has a normal mood and affect.  Nursing note and vitals reviewed.   ED Course  Procedures (including critical care time) Labs Review Labs Reviewed - No data to display  Imaging Review No results found.   EKG Interpretation None      MDM   Final diagnoses:  Corneal abrasion due to contact lens, right   Stephanie Bellini presents with corneal abrasion on PE.  Eye irrigated w NS, no evidence of FB.  No change in vision, acuity equal bilaterally.  Pt is a contact lens wearer.  Exam non-concerning for orbital cellulitis, hyphema, corneal ulcers. Patient will be discharged home with cipro drops.   Patient understands to follow up with ophthalmology ASAP, & to return to ER if new symptoms develop including change in vision, purulent drainage, or entrapment.  BP 143/92 mmHg  Pulse 96  Temp(Src)   Resp 20  SpO2 100%    Dierdre Forth, PA-C 04/06/15 2314  Dorie Rank, MD 04/10/15 1440

## 2015-04-06 NOTE — ED Notes (Signed)
Pt states she has redness and irritation to her R eye. Pt has used some eye drops with no relief. Pt states today eye started to swell. Pt states she has been wearing her contacts and symptoms get worse when she takes her contacts out. Denies itching or draining from eye. Has hx of seasonal allergies.

## 2015-04-06 NOTE — Discharge Instructions (Signed)
1. Medications: cipro ophthalmologic, usual home medications 2. Treatment: rest, drink plenty of fluids, use saline eye drops for comfort 3. Follow Up: Please followup with your ophthalmologist in 2-3 days for discussion of your diagnoses and further evaluation after today's visit; if you do not have a primary care doctor use the resource guide provided to find one; Please return to the ER for worsening symptoms, fever or other concerns    Corneal Abrasion The cornea is the clear covering at the front and center of the eye. When looking at the colored portion of the eye (iris), you are looking through the cornea. This very thin tissue is made up of many layers. The surface layer is a single layer of cells (corneal epithelium) and is one of the most sensitive tissues in the body. If a scratch or injury causes the corneal epithelium to come off, it is called a corneal abrasion. If the injury extends to the tissues below the epithelium, the condition is called a corneal ulcer. CAUSES   Scratches.  Trauma.  Foreign body in the eye. Some people have recurrences of abrasions in the area of the original injury even after it has healed (recurrent erosion syndrome). Recurrent erosion syndrome generally improves and goes away with time. SYMPTOMS   Eye pain.  Difficulty or inability to keep the injured eye open.  The eye becomes very sensitive to light.  Recurrent erosions tend to happen suddenly, first thing in the morning, usually after waking up and opening the eye. DIAGNOSIS  Your health care provider can diagnose a corneal abrasion during an eye exam. Dye is usually placed in the eye using a drop or a small paper strip moistened by your tears. When the eye is examined with a special light, the abrasion shows up clearly because of the dye. TREATMENT   Small abrasions may be treated with antibiotic drops or ointment alone.  A pressure patch may be put over the eye. If this is done, follow your  doctor's instructions for when to remove the patch. Do not drive or use machines while the eye patch is on. Judging distances is hard to do with a patch on. If the abrasion becomes infected and spreads to the deeper tissues of the cornea, a corneal ulcer can result. This is serious because it can cause corneal scarring. Corneal scars interfere with light passing through the cornea and cause a loss of vision in the involved eye. HOME CARE INSTRUCTIONS  Use medicine or ointment as directed. Only take over-the-counter or prescription medicines for pain, discomfort, or fever as directed by your health care provider.  Do not drive or operate machinery if your eye is patched. Your ability to judge distances is impaired.  If your health care provider has given you a follow-up appointment, it is very important to keep that appointment. Not keeping the appointment could result in a severe eye infection or permanent loss of vision. If there is any problem keeping the appointment, let your health care provider know. SEEK MEDICAL CARE IF:   You have pain, light sensitivity, and a scratchy feeling in one eye or both eyes.  Your pressure patch keeps loosening up, and you can blink your eye under the patch after treatment.  Any kind of discharge develops from the eye after treatment or if the lids stick together in the morning.  You have the same symptoms in the morning as you did with the original abrasion days, weeks, or months after the abrasion healed. MAKE  SURE YOU:   Understand these instructions.  Will watch your condition.  Will get help right away if you are not doing well or get worse. Document Released: 10/23/2000 Document Revised: 10/31/2013 Document Reviewed: 07/03/2013 [facility] Patient Information 2015 [place], [place]. This information is not intended to replace advice given to you by your health care provider. Make sure you discuss any questions you have with your health care provider.

## 2015-08-04 ENCOUNTER — Emergency Department (HOSPITAL_COMMUNITY)
Admission: EM | Admit: 2015-08-04 | Discharge: 2015-08-04 | Disposition: A | Discharge status: Home / Self Care | Payer: 59 | Attending: Emergency Medicine | Admitting: Emergency Medicine

## 2015-08-04 ENCOUNTER — Encounter (HOSPITAL_COMMUNITY): Payer: Self-pay | Admitting: Emergency Medicine

## 2015-08-04 DIAGNOSIS — J029 Acute pharyngitis, unspecified: Secondary | ICD-10-CM | POA: Insufficient documentation

## 2015-08-04 DIAGNOSIS — Z872 Personal history of diseases of the skin and subcutaneous tissue: Secondary | ICD-10-CM | POA: Insufficient documentation

## 2015-08-04 LAB — RAPID STREP SCREEN (MED CTR MEBANE ONLY): Streptococcus, Group A Screen (Direct): NEGATIVE

## 2015-08-04 MED ORDER — PENICILLIN G BENZATHINE 1200000 UNIT/2ML IM SUSP
1.2000 10*6.[IU] | Freq: Once | INTRAMUSCULAR | Status: AC
  Administered 2015-08-04: 1.2 10*6.[IU] via INTRAMUSCULAR
  Filled 2015-08-04: qty 2

## 2015-08-04 MED ORDER — IBUPROFEN 400 MG PO TABS
800.0000 mg | ORAL_TABLET | Freq: Once | ORAL | Status: AC
  Administered 2015-08-04: 800 mg via ORAL
  Filled 2015-08-04: qty 2

## 2015-08-04 MED ORDER — LIDOCAINE VISCOUS 2 % MT SOLN: 20.0000 mL | OROMUCOSAL | Status: DC | PRN

## 2015-08-04 MED ORDER — DEXAMETHASONE SODIUM PHOSPHATE 10 MG/ML IJ SOLN
20.0000 mg | Freq: Once | INTRAMUSCULAR | Status: AC
  Administered 2015-08-04: 20 mg via INTRAMUSCULAR
  Filled 2015-08-04: qty 2

## 2015-08-04 MED ORDER — LIDOCAINE VISCOUS 2 % MT SOLN
15.0000 mL | Freq: Once | OROMUCOSAL | Status: AC
  Administered 2015-08-04: 15 mL via OROMUCOSAL
  Filled 2015-08-04: qty 15

## 2015-08-04 NOTE — ED Notes (Signed)
Pt. Stated, I've had a sore troat for 3 days, I can't even eat.

## 2015-08-04 NOTE — Discharge Instructions (Signed)
Pharyngitis Return for difficulty breathing or tolerating fluids. Follow-up with her primary care physician. Pharyngitis is a sore throat (pharynx). There is redness, pain, and swelling of your throat. HOME CARE   Drink enough fluids to keep your pee (urine) clear or pale yellow.  Only take medicine as told by your doctor.  You may get sick again if you do not take medicine as told. Finish your medicines, even if you start to feel better.  Do not take aspirin.  Rest.  Rinse your mouth (gargle) with salt water ( tsp of salt per 1 qt of water) every 1-2 hours. This will help the pain.  If you are not at risk for choking, you can suck on hard candy or sore throat lozenges. GET HELP IF:  You have large, tender lumps on your neck.  You have a rash.  You cough up green, yellow-brown, or bloody spit. GET HELP RIGHT AWAY IF:   You have a stiff neck.  You drool or cannot swallow liquids.  You throw up (vomit) or are not able to keep medicine or liquids down.  You have very bad pain that does not go away with medicine.  You have problems breathing (not from a stuffy nose). MAKE SURE YOU:   Understand these instructions.  Will watch your condition.  Will get help right away if you are not doing well or get worse. Document Released: 04/13/2008 Document Revised: 08/16/2013 Document Reviewed: 07/03/2013 [facility] Patient Information 2015 [place], [place]. This information is not intended to replace advice given to you by your health care provider. Make sure you discuss any questions you have with your health care provider.

## 2015-08-04 NOTE — ED Notes (Signed)
PT refuses to stay for 30 mins post antibx injection.

## 2015-08-04 NOTE — ED Provider Notes (Signed)
CSN: [id number]     Arrival date & time 08/04/15  1335 History  This chart was scribed for Catha Gosselin, PA-C, working with Marily Memos, MD by Octavia Heir, ED Scribe. This patient was seen in room TR06C/TR06C and the patient's care was started at 2:23 PM.     Chief Complaint  Patient presents with  . Sore Throat      The history is provided by the patient. No language interpreter was used.   HPI Comments: Stephanie Sosa is a 24 y.o. female who presents to the Emergency Department complaining of constant, gradual worsening sore throat onset 3 days ago. Pt has difficulty swallowing and states that her legs have been achy. She has not taken any medication to alleviate the pain but states she has tried using natural remedies such as chewing ginger and apple cider vinegar. Pt denies fever or cough.  Past Medical History  Diagnosis Date  . Eczema    History reviewed. No pertinent past surgical history. No family history on file. Social History  Substance Use Topics  . Smoking status: Never Smoker   . Smokeless tobacco: None  . Alcohol Use: No   OB History    No data available     Review of Systems  Constitutional: Negative for fever.  HENT: Positive for sore throat and trouble swallowing. Negative for drooling.   Respiratory: Negative for cough and shortness of breath.       Allergies  Banana  Home Medications   Prior to Admission medications   Medication Sig Start Date End Date Taking? Authorizing Provider  acetaminophen (TYLENOL) 500 MG tablet Take 1,000 mg by mouth every 6 (six) hours as needed for mild pain or moderate pain.    Historical Provider, MD  ciprofloxacin (CILOXAN) 0.3 % ophthalmic solution Place 1 drop into both eyes every 2 (two) hours. Administer 1 drop, every 2 hours, while awake, for 2 days. Then 1 drop, every 4 hours, while awake, for the next 5 days. 04/06/15   Hannah Muthersbaugh, PA-C  HYDROcodone-acetaminophen (NORCO/VICODIN) 5-325 MG per  tablet Take 1 tablet by mouth every 4 (four) hours as needed for moderate pain. Patient not taking: Reported on 04/06/2015 11/04/13   Azalia Bilis, MD  ibuprofen (ADVIL,MOTRIN) 200 MG tablet Take 400 mg by mouth every 6 (six) hours as needed for mild pain, moderate pain or cramping.    Historical Provider, MD  lidocaine (XYLOCAINE) 2 % solution Use as directed 20 mLs in the mouth or throat as needed for mouth pain. 08/04/15   Hanna Patel-Mills, PA-C  naproxen sodium (ANAPROX) 220 MG tablet Take 440 mg by mouth every 12 (twelve) hours as needed (cramping).    Historical Provider, MD  oxyCODONE-acetaminophen (PERCOCET/ROXICET) 5-325 MG per tablet Take 1 tablet by mouth every 4 (four) hours as needed for severe pain. Patient not taking: Reported on 04/06/2015 10/26/13   Antony Madura, PA-C   Triage vitals: BP 112/76 mmHg  Pulse 86  Temp(Src) 98 F (36.7 C)  Resp 16  Ht 5' 4.5" (1.638 m)  Wt 134 lb (60.782 kg)  BMI 22.65 kg/m2  SpO2 99%  LMP 07/21/2015 Physical Exam  Constitutional: She appears well-developed and well-nourished. No distress.  HENT:  Head: Normocephalic and atraumatic.  she has edema and erythema to bilateral tonsils, she also has left sided anterior cervical lymphadenopathy with tenderness but no midline swelling or fluctuance. Patent airway, no hot potato voice, no drooling or trismus, no kissing tonsils, no tonsillar or oropharyngeal exudates,  Eyes: Right eye exhibits no discharge. Left eye exhibits no discharge.  Pulmonary/Chest: Effort normal. No respiratory distress.  Neurological: She is alert. Coordination normal.  Skin: No rash noted. She is not diaphoretic.  Psychiatric: She has a normal mood and affect. Her behavior is normal.  Nursing note and vitals reviewed.   ED Course  Procedures  DIAGNOSTIC STUDIES: Oxygen Saturation is 99% on RA, normal by my interpretation.  COORDINATION OF CARE:  2:26 PM Discussed treatment plan with pt at bedside and pt agreed to  plan.  Labs Review Labs Reviewed  RAPID STREP SCREEN (NOT AT [facility])  CULTURE, GROUP A STREP    Imaging Review No results found.  EKG Interpretation None      MDM   Final diagnoses:  Pharyngitis  Patient presents for sore throat. I reviewed this [facility] criteria and believed that she may benefit from anabiotic's. She is tolerating by mouth fluids. I discussed return precautions with the patient as well as follow-up and she verbally agrees with the plan. Filed Vitals:   08/04/15 1347  BP: 112/76  Pulse: 86  Temp: 98 F (36.7 C)  Resp: 16   Medications  dexamethasone (DECADRON) injection 20 mg (20 mg Intramuscular Given 08/04/15 1456)  lidocaine (XYLOCAINE) 2 % viscous mouth solution 15 mL (15 mLs Mouth/Throat Given 08/04/15 1456)  penicillin g benzathine (BICILLIN LA) 1200000 UNIT/2ML injection 1.2 Million Units (1.2 Million Units Intramuscular Given 08/04/15 1532)   I personally performed the services described in this documentation, which was scribed in my presence. The recorded information has been reviewed and is accurate.   Catha Gosselin, PA-C 08/04/15 1543  Marily Memos, MD 08/05/15 [phone]

## 2015-08-06 LAB — CULTURE, GROUP A STREP: Strep A Culture: POSITIVE — AB

## 2015-08-07 ENCOUNTER — Telehealth (HOSPITAL_BASED_OUTPATIENT_CLINIC_OR_DEPARTMENT_OTHER): Payer: Self-pay | Admitting: Emergency Medicine

## 2015-08-07 NOTE — Telephone Encounter (Signed)
Post ED Visit - Positive Culture Follow-up  Culture report reviewed by antimicrobial stewardship pharmacist:   Celedonio Miyamoto, Pharm.D., BCPS  Georgina Pillion, Pharm.D., BCPS  [place], [address].D., BCPS, AAHIVP  Estella Husk, Pharm.D., BCPS, AAHIVP  [place], [address].D.  Cassie Roseanne Reno, [place].D.  Positive strep Treated with PCN , organism sensitive to the same and no further patient follow-up is required at this time.  Berle Mull 08/07/2015, 11:18 AM

## 2015-09-01 ENCOUNTER — Emergency Department (HOSPITAL_COMMUNITY)
Admission: EM | Admit: 2015-09-01 | Discharge: 2015-09-01 | Disposition: A | Discharge status: Home / Self Care | Payer: 59 | Attending: Emergency Medicine | Admitting: Emergency Medicine

## 2015-09-01 ENCOUNTER — Encounter (HOSPITAL_COMMUNITY): Payer: Self-pay | Admitting: Emergency Medicine

## 2015-09-01 DIAGNOSIS — S0990XA Unspecified injury of head, initial encounter: Secondary | ICD-10-CM | POA: Insufficient documentation

## 2015-09-01 DIAGNOSIS — S79912A Unspecified injury of left hip, initial encounter: Secondary | ICD-10-CM | POA: Diagnosis present

## 2015-09-01 DIAGNOSIS — S7012XA Contusion of left thigh, initial encounter: Secondary | ICD-10-CM | POA: Insufficient documentation

## 2015-09-01 DIAGNOSIS — S79922A Unspecified injury of left thigh, initial encounter: Secondary | ICD-10-CM | POA: Insufficient documentation

## 2015-09-01 DIAGNOSIS — Y92481 Parking lot as the place of occurrence of the external cause: Secondary | ICD-10-CM | POA: Diagnosis not present

## 2015-09-01 DIAGNOSIS — S7002XA Contusion of left hip, initial encounter: Secondary | ICD-10-CM | POA: Insufficient documentation

## 2015-09-01 DIAGNOSIS — Y998 Other external cause status: Secondary | ICD-10-CM | POA: Insufficient documentation

## 2015-09-01 DIAGNOSIS — Y9389 Activity, other specified: Secondary | ICD-10-CM | POA: Diagnosis not present

## 2015-09-01 DIAGNOSIS — Z872 Personal history of diseases of the skin and subcutaneous tissue: Secondary | ICD-10-CM | POA: Insufficient documentation

## 2015-09-01 MED ORDER — IBUPROFEN 800 MG PO TABS
800.0000 mg | ORAL_TABLET | Freq: Three times a day (TID) | ORAL | Status: DC
Start: 2015-09-01 — End: 2015-09-05

## 2015-09-01 MED ORDER — METHOCARBAMOL 500 MG PO TABS: 500.0000 mg | ORAL_TABLET | Freq: Two times a day (BID) | ORAL | Status: DC

## 2015-09-01 NOTE — ED Notes (Signed)
Patient c/o leg pain, would like medication.

## 2015-09-01 NOTE — Discharge Instructions (Signed)
Cryotherapy °Cryotherapy means treatment with cold. Ice or gel packs can be used to reduce both pain and swelling. Ice is the most helpful within the first 24 to 48 hours after an injury or flare-up from overusing a muscle or joint. Sprains, strains, spasms, burning pain, shooting pain, and aches can all be eased with ice. Ice can also be used when recovering from surgery. Ice is effective, has very few side effects, and is safe for most people to use. °PRECAUTIONS  °Ice is not a safe treatment option for people with: °· Raynaud phenomenon. This is a condition affecting small blood vessels in the extremities. Exposure to cold may cause your problems to return. °· Cold hypersensitivity. There are many forms of cold hypersensitivity, including: °¨ Cold urticaria. Red, itchy hives appear on the skin when the tissues begin to warm after being iced. °¨ Cold erythema. This is a red, itchy rash caused by exposure to cold. °¨ Cold hemoglobinuria. Red blood cells break down when the tissues begin to warm after being iced. The hemoglobin that carry oxygen are passed into the urine because they cannot combine with blood proteins fast enough. °· Numbness or altered sensitivity in the area being iced. °If you have any of the following conditions, do not use ice until you have discussed cryotherapy with your caregiver: °· Heart conditions, such as arrhythmia, angina, or chronic heart disease. °· High blood pressure. °· Healing wounds or open skin in the area being iced. °· Current infections. °· Rheumatoid arthritis. °· Poor circulation. °· Diabetes. °Ice slows the blood flow in the region it is applied. This is beneficial when trying to stop inflamed tissues from spreading irritating chemicals to surrounding tissues. However, if you expose your skin to cold temperatures for too long or without the proper protection, you can damage your skin or nerves. Watch for signs of skin damage due to cold. °HOME CARE INSTRUCTIONS °Follow  these tips to use ice and cold packs safely. °· Place a dry or damp towel between the ice and skin. A damp towel will cool the skin more quickly, so you may need to shorten the time that the ice is used. °· For a more rapid response, add gentle compression to the ice. °· Ice for no more than 10 to 20 minutes at a time. The bonier the area you are icing, the less time it will take to get the benefits of ice. °· Check your skin after 5 minutes to make sure there are no signs of a poor response to cold or skin damage. °· Rest 20 minutes or more between uses. °· Once your skin is numb, you can end your treatment. You can test numbness by very lightly touching your skin. The touch should be so light that you do not see the skin dimple from the pressure of your fingertip. When using ice, most people will feel these normal sensations in this order: cold, burning, aching, and numbness. °· Do not use ice on someone who cannot communicate their responses to pain, such as small children or people with dementia. °HOW TO MAKE AN ICE PACK °Ice packs are the most common way to use ice therapy. Other methods include ice massage, ice baths, and cryosprays. Muscle creams that cause a cold, tingly feeling do not offer the same benefits that ice offers and should not be used as a substitute unless recommended by your caregiver. °To make an ice pack, do one of the following: °· Place crushed ice or a   bag of frozen vegetables in a sealable plastic bag. Squeeze out the excess air. Place this bag inside another plastic bag. Slide the bag into a pillowcase or place a damp towel between your skin and the bag.  Mix 3 parts water with 1 part rubbing alcohol. Freeze the mixture in a sealable plastic bag. When you remove the mixture from the freezer, it will be slushy. Squeeze out the excess air. Place this bag inside another plastic bag. Slide the bag into a pillowcase or place a damp towel between your skin and the bag. SEEK MEDICAL CARE  IF:  You develop white spots on your skin. This may give the skin a blotchy (mottled) appearance.  Your skin turns blue or pale.  Your skin becomes waxy or hard.  Your swelling gets worse. MAKE SURE YOU:   Understand these instructions.  Will watch your condition.  Will get help right away if you are not doing well or get worse.   This information is not intended to replace advice given to you by your health care provider. Make sure you discuss any questions you have with your health care provider.   Document Released: 06/22/2011 Document Revised: 11/16/2014 Document Reviewed: 06/22/2011 Elsevier Interactive Patient Education 2016 [place] A contusion is a deep bruise. Contusions are the result of a blunt injury to tissues and muscle fibers under the skin. The injury causes bleeding under the skin. The skin overlying the contusion may turn blue, purple, or yellow. Minor injuries will give you a painless contusion, but more severe contusions may stay painful and swollen for a few weeks.  CAUSES  This condition is usually caused by a blow, trauma, or direct force to an area of the body. SYMPTOMS  Symptoms of this condition include:  Swelling of the injured area.  Pain and tenderness in the injured area.  Discoloration. The area may have redness and then turn blue, purple, or yellow. DIAGNOSIS  This condition is diagnosed based on a physical exam and medical history. An X-ray, CT scan, or MRI may be needed to determine if there are any associated injuries, such as broken bones (fractures). TREATMENT  Specific treatment for this condition depends on what area of the body was injured. In general, the best treatment for a contusion is resting, icing, applying pressure to (compression), and elevating the injured area. This is often called the RICE strategy. Over-the-counter anti-inflammatory medicines may also be recommended for pain control.  HOME CARE INSTRUCTIONS     Rest the injured area.  If directed, apply ice to the injured area:  Put ice in a plastic bag.  Place a towel between your skin and the bag.  Leave the ice on for 20 minutes, 2-3 times per day.  If directed, apply light compression to the injured area using an elastic bandage. Make sure the bandage is not wrapped too tightly. Remove and reapply the bandage as directed by your health care provider.  If possible, raise (elevate) the injured area above the level of your heart while you are sitting or lying down.  Take over-the-counter and prescription medicines only as told by your health care provider. SEEK MEDICAL CARE IF:  Your symptoms do not improve after several days of treatment.  Your symptoms get worse.  You have difficulty moving the injured area. SEEK IMMEDIATE MEDICAL CARE IF:   You have severe pain.  You have numbness in a hand or foot.  Your hand or foot turns pale or cold.  This information is not intended to replace advice given to you by your health care provider. Make sure you discuss any questions you have with your health care provider.   Document Released: 08/05/2005 Document Revised: 07/17/2015 Document Reviewed: 03/13/2015 Elsevier Interactive Patient Education 2016 [employer] Tourist information centre managerMotor Vehicle Collision It is common to have multiple bruises and sore muscles after a motor vehicle collision (MVC). These tend to feel worse for the first 24 hours. You may have the most stiffness and soreness over the first several hours. You may also feel worse when you wake up the first morning after your collision. After this point, you will usually begin to improve with each day. The speed of improvement often depends on the severity of the collision, the number of injuries, and the location and nature of these injuries. HOME CARE INSTRUCTIONS  Put ice on the injured area.  Put ice in a plastic bag.  Place a towel between your skin and the bag.  Leave the ice on  for 15-20 minutes, 3-4 times a day, or as directed by your health care provider.  Drink enough fluids to keep your urine clear or pale yellow. Do not drink alcohol.  Take a warm shower or bath once or twice a day. This will increase blood flow to sore muscles.  You may return to activities as directed by your caregiver. Be careful when lifting, as this may aggravate neck or back pain.  Only take over-the-counter or prescription medicines for pain, discomfort, or fever as directed by your caregiver. Do not use aspirin. This may increase bruising and bleeding. SEEK IMMEDIATE MEDICAL CARE IF:  You have numbness, tingling, or weakness in the arms or legs.  You develop severe headaches not relieved with medicine.  You have severe neck pain, especially tenderness in the middle of the back of your neck.  You have changes in bowel or bladder control.  There is increasing pain in any area of the body.  You have shortness of breath, light-headedness, dizziness, or fainting.  You have chest pain.  You feel sick to your stomach (nauseous), throw up (vomit), or sweat.  You have increasing abdominal discomfort.  There is blood in your urine, stool, or vomit.  You have pain in your shoulder (shoulder strap areas).  You feel your symptoms are getting worse. MAKE SURE YOU:  Understand these instructions.  Will watch your condition.  Will get help right away if you are not doing well or get worse.   This information is not intended to replace advice given to you by your health care provider. Make sure you discuss any questions you have with your health care provider.   Document Released: 10/26/2005 Document Revised: 11/16/2014 Document Reviewed: 03/25/2011 Elsevier Interactive Patient Education [employer] Inc[employer]
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
## 2015-09-05 ENCOUNTER — Emergency Department (HOSPITAL_COMMUNITY)
Admission: EM | Admit: 2015-09-05 | Discharge: 2015-09-05 | Disposition: A | Discharge status: Home / Self Care | Payer: 59 | Attending: Emergency Medicine | Admitting: Emergency Medicine

## 2015-09-05 ENCOUNTER — Encounter (HOSPITAL_COMMUNITY): Payer: Self-pay | Admitting: Emergency Medicine

## 2015-09-05 DIAGNOSIS — Y998 Other external cause status: Secondary | ICD-10-CM | POA: Insufficient documentation

## 2015-09-05 DIAGNOSIS — Z792 Long term (current) use of antibiotics: Secondary | ICD-10-CM | POA: Insufficient documentation

## 2015-09-05 DIAGNOSIS — R112 Nausea with vomiting, unspecified: Secondary | ICD-10-CM | POA: Insufficient documentation

## 2015-09-05 DIAGNOSIS — Y92410 Unspecified street and highway as the place of occurrence of the external cause: Secondary | ICD-10-CM | POA: Diagnosis not present

## 2015-09-05 DIAGNOSIS — Y9389 Activity, other specified: Secondary | ICD-10-CM | POA: Insufficient documentation

## 2015-09-05 DIAGNOSIS — S4991XA Unspecified injury of right shoulder and upper arm, initial encounter: Secondary | ICD-10-CM | POA: Diagnosis present

## 2015-09-05 DIAGNOSIS — Z872 Personal history of diseases of the skin and subcutaneous tissue: Secondary | ICD-10-CM | POA: Diagnosis not present

## 2015-09-05 DIAGNOSIS — M79602 Pain in left arm: Secondary | ICD-10-CM

## 2015-09-05 MED ORDER — NAPROXEN 250 MG PO TABS: 250.0000 mg | ORAL_TABLET | Freq: Two times a day (BID) | ORAL | Status: DC

## 2015-09-05 NOTE — Discharge Instructions (Signed)
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
## 2016-12-10 ENCOUNTER — Encounter (HOSPITAL_COMMUNITY): Payer: Self-pay

## 2016-12-10 DIAGNOSIS — R509 Fever, unspecified: Secondary | ICD-10-CM | POA: Diagnosis present

## 2016-12-10 DIAGNOSIS — J111 Influenza due to unidentified influenza virus with other respiratory manifestations: Secondary | ICD-10-CM | POA: Diagnosis not present

## 2016-12-10 DIAGNOSIS — Z79899 Other long term (current) drug therapy: Secondary | ICD-10-CM | POA: Diagnosis not present

## 2016-12-10 MED ORDER — ACETAMINOPHEN 325 MG PO TABS
650.0000 mg | ORAL_TABLET | Freq: Once | ORAL | Status: DC | PRN
  Filled 2016-12-10: qty 2

## 2016-12-10 NOTE — ED Triage Notes (Signed)
Pt presents with c/o chills, body aches, vomiting and bilateral otalgia. Pt reports her ears have been bothering her for several weeks but her other symptoms only started today. Pt reports she has rested today and tried to stay hydrated but that she is progressively feeling worse.

## 2016-12-11 ENCOUNTER — Emergency Department (HOSPITAL_COMMUNITY)
Admission: EM | Admit: 2016-12-11 | Discharge: 2016-12-11 | Disposition: A | Discharge status: Home / Self Care | Payer: 59 | Attending: Emergency Medicine | Admitting: Emergency Medicine

## 2016-12-11 DIAGNOSIS — R69 Illness, unspecified: Secondary | ICD-10-CM

## 2016-12-11 DIAGNOSIS — J111 Influenza due to unidentified influenza virus with other respiratory manifestations: Secondary | ICD-10-CM

## 2016-12-11 MED ORDER — OSELTAMIVIR PHOSPHATE 75 MG PO CAPS: 75.0000 mg | ORAL_CAPSULE | Freq: Two times a day (BID) | ORAL | 0 refills | Status: DC

## 2016-12-11 MED ORDER — ONDANSETRON HCL 4 MG PO TABS: 4.0000 mg | ORAL_TABLET | Freq: Four times a day (QID) | ORAL | 0 refills | Status: DC

## 2016-12-11 NOTE — ED Provider Notes (Signed)
[facility]-EMERGENCY DEPT Provider Note   CSN: [id number] Arrival date & time: 12/10/16  2207     History   Chief Complaint Chief Complaint  Patient presents with  . Otalgia  . Chills  . Generalized Body Aches  . Fever    HPI Stephanie Sosa is a 26 y.o. female.  Patient presents with URI symptoms of cough, congestion, rhinorrhea, sneezing, sore throat, body aches and fever x 1 day. She also reports bilateral ear pain x 3 weeks, seen by Urgent Care and treated with decongestants without resolution. She has had nausea and vomiting as well. No known sick contacts. She has been drinking fluids but continues to feel worse.       Past Medical History:  Diagnosis Date  . Eczema     There are no active problems to display for this patient.   History reviewed. No pertinent surgical history.  OB History    No data available       Home Medications    Prior to Admission medications   Medication Sig Start Date End Date Taking? Authorizing Provider  acetaminophen (TYLENOL) 500 MG tablet Take 1,000 mg by mouth every 6 (six) hours as needed for mild pain or moderate pain.    Historical Provider, MD  ciprofloxacin (CILOXAN) 0.3 % ophthalmic solution Place 1 drop into both eyes every 2 (two) hours. Administer 1 drop, every 2 hours, while awake, for 2 days. Then 1 drop, every 4 hours, while awake, for the next 5 days. 04/06/15   Hannah Muthersbaugh, PA-C  lidocaine (XYLOCAINE) 2 % solution Use as directed 20 mLs in the mouth or throat as needed for mouth pain. 08/04/15   Hanna Patel-Mills, PA-C  naproxen (NAPROSYN) 250 MG tablet Take 1 tablet (250 mg total) by mouth 2 (two) times daily with a meal. 09/05/15   Everlene Farrier, PA-C    Family History No family history on file.  Social History Social History  Substance Use Topics  . Smoking status: Never Smoker  . Smokeless tobacco: Not on file  . Alcohol use No     Allergies   Banana   Review of Systems Review of Systems    Constitutional: Positive for chills and fever.  HENT: Positive for congestion, ear pain, rhinorrhea, sneezing and sore throat. Negative for facial swelling and trouble swallowing.   Respiratory: Positive for cough. Negative for shortness of breath.   Cardiovascular: Negative.  Negative for chest pain.  Gastrointestinal: Positive for nausea and vomiting. Negative for abdominal pain.  Musculoskeletal: Positive for myalgias.  Skin: Negative.  Negative for rash.  Neurological: Negative.      Physical Exam Updated Vital Signs BP 107/80 (BP Location: Left Arm)   Pulse 90   Temp 99.3 F (37.4 C) (Oral)   Resp 20   Ht 5' 4.5" (1.638 m)   Wt 78 kg   LMP 11/30/2016 (Approximate)   SpO2 100%   BMI 29.07 kg/m   Physical Exam  Constitutional: She is oriented to person, place, and time. She appears well-developed and well-nourished.  HENT:  Head: Normocephalic.  Nose: Mucosal edema present.  Mouth/Throat: Oropharynx is clear and moist.  Neck: Normal range of motion. Neck supple.  Cardiovascular: Normal rate and regular rhythm.   No murmur heard. Pulmonary/Chest: Effort normal and breath sounds normal. She has no wheezes. She has no rales.  Abdominal: Soft. Bowel sounds are normal. There is no tenderness. There is no rebound and no guarding.  Musculoskeletal: Normal range of motion.  Neurological: She is alert and oriented to person, place, and time.  Skin: Skin is warm and dry. No rash noted.  Psychiatric: She has a normal mood and affect.     ED Treatments / Results  Labs (all labs ordered are listed, but only abnormal results are displayed) Labs Reviewed - No data to display  EKG  EKG Interpretation None       Radiology No results found.  Procedures Procedures (including critical care time)  Medications Ordered in ED Medications  acetaminophen (TYLENOL) tablet 650 mg (650 mg Oral Not Given 12/10/16 2233)     Initial Impression / Assessment and Plan / ED Course   I have reviewed the triage vital signs and the nursing notes.  Pertinent labs & imaging results that were available during my care of the patient were reviewed by me and considered in my medical decision making (see chart for details).     Patient presents with flu like symptoms but appears well, non-toxic. No vomiting here. She is drinking fluids, is not tachycardic, appears hydrated.   She can be discharged home with Tamiflu and prn Zofran. Care instructions discussed.  Final Clinical Impressions(s) / ED Diagnoses   Final diagnoses:  None   1. Influenza-like illness  New Prescriptions New Prescriptions   No medications on file     Elpidio AnisShari Sumedha Munnerlyn, Cordelia Poche-C 12/11/16 0234    Dione Boozeavid Glick, MD 12/11/16 [phone]

## 2018-01-18 ENCOUNTER — Emergency Department (HOSPITAL_COMMUNITY): Payer: 59

## 2018-01-18 ENCOUNTER — Encounter (HOSPITAL_COMMUNITY): Payer: Self-pay | Admitting: Emergency Medicine

## 2018-01-18 ENCOUNTER — Emergency Department (HOSPITAL_COMMUNITY)
Admission: EM | Admit: 2018-01-18 | Discharge: 2018-01-18 | Disposition: A | Discharge status: Home / Self Care | Payer: 59 | Attending: Emergency Medicine | Admitting: Emergency Medicine

## 2018-01-18 ENCOUNTER — Other Ambulatory Visit: Payer: Self-pay

## 2018-01-18 DIAGNOSIS — R05 Cough: Secondary | ICD-10-CM | POA: Diagnosis present

## 2018-01-18 DIAGNOSIS — J111 Influenza due to unidentified influenza virus with other respiratory manifestations: Secondary | ICD-10-CM

## 2018-01-18 DIAGNOSIS — Z79899 Other long term (current) drug therapy: Secondary | ICD-10-CM | POA: Diagnosis not present

## 2018-01-18 LAB — CBC WITH DIFFERENTIAL/PLATELET
Basophils Absolute: 0 10*3/uL (ref 0.0–0.1)
Basophils Relative: 0 %
Eosinophils Absolute: 0 10*3/uL (ref 0.0–0.7)
Eosinophils Relative: 0 %
HCT: 37.9 % (ref 36.0–46.0)
Hemoglobin: 12.5 g/dL (ref 12.0–15.0)
Lymphocytes Relative: 4 %
Lymphs Abs: 0.3 10*3/uL — ABNORMAL LOW (ref 0.7–4.0)
MCH: 30.2 pg (ref 26.0–34.0)
MCHC: 33 g/dL (ref 30.0–36.0)
MCV: 91.5 fL (ref 78.0–100.0)
Monocytes Absolute: 0.8 10*3/uL (ref 0.1–1.0)
Monocytes Relative: 12 %
Neutro Abs: 6 10*3/uL (ref 1.7–7.7)
Neutrophils Relative %: 84 %
Platelets: 141 10*3/uL — ABNORMAL LOW (ref 150–400)
RBC: 4.14 MIL/uL (ref 3.87–5.11)
RDW: 13.7 % (ref 11.5–15.5)
WBC: 7.1 10*3/uL (ref 4.0–10.5)

## 2018-01-18 LAB — URINALYSIS, ROUTINE W REFLEX MICROSCOPIC
Bilirubin Urine: NEGATIVE
Glucose, UA: NEGATIVE mg/dL
Hgb urine dipstick: NEGATIVE
Ketones, ur: NEGATIVE mg/dL
Leukocytes, UA: NEGATIVE
Nitrite: NEGATIVE
Protein, ur: NEGATIVE mg/dL
Specific Gravity, Urine: 1.009 (ref 1.005–1.030)
pH: 5 (ref 5.0–8.0)

## 2018-01-18 LAB — COMPREHENSIVE METABOLIC PANEL
ALT: 13 U/L — ABNORMAL LOW (ref 14–54)
AST: 20 U/L (ref 15–41)
Albumin: 3.8 g/dL (ref 3.5–5.0)
Alkaline Phosphatase: 49 U/L (ref 38–126)
Anion gap: 9 (ref 5–15)
BUN: <5 mg/dL — ABNORMAL LOW (ref 6–20)
CO2: 22 mmol/L (ref 22–32)
Calcium: 9.1 mg/dL (ref 8.9–10.3)
Chloride: 107 mmol/L (ref 101–111)
Creatinine, Ser: 0.94 mg/dL (ref 0.44–1.00)
GFR calc Af Amer: >60 mL/min (ref >60)
GFR calc non Af Amer: >60 mL/min (ref >60)
Glucose, Bld: 118 mg/dL — ABNORMAL HIGH (ref 65–99)
Potassium: 3.4 mmol/L — ABNORMAL LOW (ref 3.5–5.1)
Sodium: 138 mmol/L (ref 135–145)
Total Bilirubin: 0.8 mg/dL (ref 0.3–1.2)
Total Protein: 7.6 g/dL (ref 6.5–8.1)

## 2018-01-18 LAB — I-STAT BETA HCG BLOOD, ED (MC, WL, AP ONLY): I-stat hCG, quantitative: <5 m[IU]/mL (ref <5)

## 2018-01-18 LAB — I-STAT CG4 LACTIC ACID, ED: Lactic Acid, Venous: 1.4 mmol/L (ref 0.5–1.9)

## 2018-01-18 MED ORDER — OSELTAMIVIR PHOSPHATE 75 MG PO CAPS: 75.0000 mg | ORAL_CAPSULE | Freq: Two times a day (BID) | ORAL | 0 refills | Status: DC

## 2018-01-18 MED ORDER — ACETAMINOPHEN 500 MG PO TABS
1000.0000 mg | ORAL_TABLET | Freq: Once | ORAL | Status: AC
  Administered 2018-01-18: 1000 mg via ORAL
  Filled 2018-01-18: qty 2

## 2018-01-18 MED ORDER — ONDANSETRON 4 MG PO TBDP
4.0000 mg | ORAL_TABLET | Freq: Once | ORAL | Status: AC
  Administered 2018-01-18: 4 mg via ORAL
  Filled 2018-01-18: qty 1

## 2018-01-18 NOTE — ED Triage Notes (Signed)
Pt c/o generalized body aches, fever, coughing, vomiting started yesterday--

## 2018-01-18 NOTE — Discharge Instructions (Signed)
Please read attached information. If you experience any new or worsening signs or symptoms please return to the emergency room for evaluation. Please follow-up with your primary care provider or specialist as discussed. Please use medication prescribed only as directed and discontinue taking if you have any concerning signs or symptoms.   °

## 2018-01-18 NOTE — ED Provider Notes (Signed)
[facility]   CSN: [id number] Arrival date & time: 01/18/18  1153  History   Chief Complaint Chief Complaint  Patient presents with  . flu symptoms    HPI Julieta BelliniJasmine S Shaub is a 27 y.o. female.  HPI    27 year old female presents today with complaints of influenza.  Patient notes yesterday she started having vomiting, cough, body aches and fatigue.  She notes no more vomiting but continues to have the body ache cough and fatigue.  Patient reports 1 of her close friends was recently diagnosed with the flu.  She notes she has been able to tolerate p.o. without significant difficulty.  She denies any significant chest pain, reports some minor shortness of breath.  Patient reports that she does smoke cigarettes, denies any history of asthma COPD or any other significant past medical history.   Past Medical History:  Diagnosis Date  . Eczema     There are no active problems to display for this patient.   History reviewed. No pertinent surgical history.  OB History    No data available       Home Medications    Prior to Admission medications   Medication Sig Start Date End Date Taking? Authorizing Provider  acetaminophen (TYLENOL) 500 MG tablet Take 1,000 mg by mouth every 6 (six) hours as needed for mild pain or moderate pain.    [provider]  ciprofloxacin (CILOXAN) 0.3 % ophthalmic solution Place 1 drop into both eyes every 2 (two) hours. Administer 1 drop, every 2 hours, while awake, for 2 days. Then 1 drop, every 4 hours, while awake, for the next 5 days. 04/06/15   Muthersbaugh, Dahlia ClientHannah, PA-C  lidocaine (XYLOCAINE) 2 % solution Use as directed 20 mLs in the mouth or throat as needed for mouth pain. 08/04/15   Patel-Mills, Lorelle FormosaHanna, PA-C  naproxen (NAPROSYN) 250 MG tablet Take 1 tablet (250 mg total) by mouth 2 (two) times daily with a meal. 09/05/15   Everlene Farrieransie, William, PA-C  ondansetron (ZOFRAN) 4 MG tablet Take 1  tablet (4 mg total) by mouth every 6 (six) hours. 12/11/16   Elpidio AnisUpstill, Shari, PA-C  oseltamivir (TAMIFLU) 75 MG capsule Take 1 capsule (75 mg total) by mouth every 12 (twelve) hours. 01/18/18   Eyvonne MechanicHedges, Gerre Ranum, PA-C    Family History No family history on file.  Social History Social History   Tobacco Use  . Smoking status: Never Smoker  . Smokeless tobacco: Never Used  Substance Use Topics  . Alcohol use: No  . Drug use: No     Allergies   Banana   Review of Systems Review of Systems  All other systems reviewed and are negative.   Physical Exam Updated Vital Signs BP 119/86 (BP Location: Right Arm)   Pulse 87   Temp 100.3 F (37.9 C) (Oral)   Resp 18   LMP 01/12/2018 (Approximate)   SpO2 100%    Physical Exam  Constitutional: She is oriented to person, place, and time. She appears well-developed and well-nourished.  HENT:  Head: Normocephalic and atraumatic.  Rhinorrhea  Eyes: Conjunctivae are normal. Pupils are equal, round, and reactive to light. Right eye exhibits no discharge. Left eye exhibits no discharge. No scleral icterus.  Neck: Normal range of motion. No JVD present. No tracheal deviation present.  Cardiovascular: Normal rate, regular rhythm, normal heart sounds and intact distal pulses. Exam reveals no gallop and no friction rub.  No murmur heard. Pulmonary/Chest: Effort normal  and breath sounds normal. No stridor. No respiratory distress. She has no wheezes. She has no rales. She exhibits no tenderness.  Neurological: She is alert and oriented to person, place, and time. Coordination normal.  Psychiatric: She has a normal mood and affect. Her behavior is normal. Judgment and thought content normal.  Nursing note and vitals reviewed.   ED Treatments / Results  Labs (all labs ordered are listed, but only abnormal results are displayed) Labs Reviewed  COMPREHENSIVE METABOLIC PANEL - Abnormal; Notable for the following components:      Result Value    Potassium 3.4 (*)    Glucose, Bld 118 (*)    BUN <5 (*)    ALT 13 (*)    All other components within normal limits  CBC WITH DIFFERENTIAL/PLATELET - Abnormal; Notable for the following components:   Platelets 141 (*)    Lymphs Abs 0.3 (*)    All other components within normal limits  URINALYSIS, ROUTINE W REFLEX MICROSCOPIC - Abnormal; Notable for the following components:   Color, Urine STRAW (*)    All other components within normal limits  I-STAT CG4 LACTIC ACID, ED  I-STAT BETA HCG BLOOD, ED ([facility], [facility], [facility] ONLY)  I-STAT CG4 LACTIC ACID, ED    EKG  EKG Interpretation None       Radiology Dg Chest 2 View  Result Date: 01/18/2018 CLINICAL DATA:  Cough, chills, vomiting and congestion for 1 day. EXAM: CHEST - 2 VIEW COMPARISON:  PA and lateral chest 03/26/2013. FINDINGS: Lungs clear. Heart size normal. No pneumothorax or pleural effusion. No bony abnormality IMPRESSION: Normal chest. Electronically Signed   By: Drusilla Kanner M.D.   On: 01/18/2018 12:35    Procedures Procedures (including critical care time)  Medications Ordered in ED Medications  ondansetron (ZOFRAN-ODT) disintegrating tablet 4 mg (4 mg Oral Given 01/18/18 1308)  acetaminophen (TYLENOL) tablet 1,000 mg (1,000 mg Oral Given 01/18/18 1308)     Initial Impression / Assessment and Plan / ED Course  I have reviewed the triage vital signs and the nursing notes.  Pertinent labs & imaging results that were available during my care of the patient were reviewed by me and considered in my medical decision making (see chart for details).      Final Clinical Impressions(s) / ED Diagnoses   Final diagnoses:  Influenza    Labs: I-STAT lactic acid, i-STAT beta-hCG, CMP, CBC  Imaging: DG Chest 2 view   Consults:  Therapeutics:  Discharge Meds: Tamiflu Zofran  Assessment/Plan: 27 year old female presents today with complaints of flulike illness.  I do believe patient does have influenza.  She is otherwise  healthy with no comp gating features.  Negative chest x-ray clear lung sounds.  I discussed treatment options with patient, they would like a prescription at this time.  They understand risks and benefits of treatment versus nontreatment.  They are given strict return precautions, both patient and mother verbalized understanding and agreement to today's plan had no further questions or concerns at time of discharge.     ED Discharge Orders        Ordered    oseltamivir (TAMIFLU) 75 MG capsule  Every 12 hours     01/18/18 1422       Eyvonne Mechanic, PA-C 01/18/18 1502    Mabe, Latanya Maudlin, MD 01/18/18 1504

## 2018-01-18 NOTE — ED Provider Notes (Signed)
Patient placed in Quick Look pathway, seen and evaluated   Chief Complaint: flu like symptoms  HPI:   Body aches worse in legs and arms, chest tightness, cough, nausea, vomiting, fevers, chills, runny nose, congestion, urinating more frequently. Friend had flu last week.  ROS: negative abdominal pain, dysuria, hematuria, flank pain  Physical Exam:   Gen: No distress  Neuro: Awake and Alert  Skin: Warm    Focused Exam: Vomiting during exam. Clear rhinorrhea, mucosal edema, look uncomfortable. Lungs CTAB. Abdomen soft NTND. No suprapubic or CVAT.Low grade fever.    Likely ILI. Will make vertical 3. Initiation of care has begun. The patient has been counseled on the process, plan, and necessity for staying for the completion/evaluation, and the remainder of the medical screening examination    Jerrell MylarGibbons, Jashiya Bassett J, PA-C 01/18/18 1220    Mabe, Latanya MaudlinMartha L, MD 01/18/18 1251

## 2018-11-21 ENCOUNTER — Encounter: Payer: Self-pay | Admitting: Emergency Medicine

## 2018-11-21 ENCOUNTER — Emergency Department (HOSPITAL_COMMUNITY): Payer: 59

## 2018-11-21 ENCOUNTER — Emergency Department (HOSPITAL_COMMUNITY)
Admission: EM | Admit: 2018-11-21 | Discharge: 2018-11-21 | Disposition: A | Discharge status: Home / Self Care | Payer: 59 | Attending: Emergency Medicine | Admitting: Emergency Medicine

## 2018-11-21 DIAGNOSIS — O209 Hemorrhage in early pregnancy, unspecified: Secondary | ICD-10-CM

## 2018-11-21 DIAGNOSIS — R102 Pelvic and perineal pain: Secondary | ICD-10-CM | POA: Diagnosis not present

## 2018-11-21 DIAGNOSIS — B9689 Other specified bacterial agents as the cause of diseases classified elsewhere: Secondary | ICD-10-CM

## 2018-11-21 DIAGNOSIS — O468X1 Other antepartum hemorrhage, first trimester: Secondary | ICD-10-CM | POA: Insufficient documentation

## 2018-11-21 DIAGNOSIS — N760 Acute vaginitis: Secondary | ICD-10-CM | POA: Diagnosis not present

## 2018-11-21 DIAGNOSIS — Z3A01 Less than 8 weeks gestation of pregnancy: Secondary | ICD-10-CM | POA: Insufficient documentation

## 2018-11-21 LAB — CBC WITH DIFFERENTIAL/PLATELET
ABS IMMATURE GRANULOCYTES: 0.01 10*3/uL (ref 0.00–0.07)
Basophils Absolute: 0 10*3/uL (ref 0.0–0.1)
Basophils Relative: 0 %
Eosinophils Absolute: 0.1 10*3/uL (ref 0.0–0.5)
Eosinophils Relative: 1 %
HCT: 38.6 % (ref 36.0–46.0)
HEMOGLOBIN: 12.1 g/dL (ref 12.0–15.0)
Immature Granulocytes: 0 %
LYMPHS ABS: 1.4 10*3/uL (ref 0.7–4.0)
LYMPHS PCT: 19 %
MCH: 29.1 pg (ref 26.0–34.0)
MCHC: 31.3 g/dL (ref 30.0–36.0)
MCV: 92.8 fL (ref 80.0–100.0)
MONO ABS: 0.7 10*3/uL (ref 0.1–1.0)
Monocytes Relative: 10 %
NEUTROS ABS: 5 10*3/uL (ref 1.7–7.7)
Neutrophils Relative %: 70 %
Platelets: 127 10*3/uL — ABNORMAL LOW (ref 150–400)
RBC: 4.16 MIL/uL (ref 3.87–5.11)
RDW: 13.8 % (ref 11.5–15.5)
WBC: 7.2 10*3/uL (ref 4.0–10.5)
nRBC: 0 % (ref 0.0–0.2)

## 2018-11-21 LAB — URINALYSIS, ROUTINE W REFLEX MICROSCOPIC
Bacteria, UA: NONE SEEN
Bilirubin Urine: NEGATIVE
Glucose, UA: NEGATIVE mg/dL
KETONES UR: 5 mg/dL — AB
Leukocytes, UA: NEGATIVE
Nitrite: NEGATIVE
Protein, ur: NEGATIVE mg/dL
Specific Gravity, Urine: 1.008 (ref 1.005–1.030)
pH: 8 (ref 5.0–8.0)

## 2018-11-21 LAB — HCG, QUANTITATIVE, PREGNANCY: hCG, Beta Chain, Quant, S: 402 m[IU]/mL — ABNORMAL HIGH (ref <5)

## 2018-11-21 LAB — WET PREP, GENITAL
Sperm: NONE SEEN
TRICH WET PREP: NONE SEEN
YEAST WET PREP: NONE SEEN

## 2018-11-21 LAB — ABO/RH: ABO/RH(D): O POS

## 2018-11-21 LAB — COMPREHENSIVE METABOLIC PANEL
ALBUMIN: 4.3 g/dL (ref 3.5–5.0)
ALT: 13 U/L (ref 0–44)
AST: 16 U/L (ref 15–41)
Alkaline Phosphatase: 43 U/L (ref 38–126)
Anion gap: 7 (ref 5–15)
BUN: 8 mg/dL (ref 6–20)
CHLORIDE: 105 mmol/L (ref 98–111)
CO2: 23 mmol/L (ref 22–32)
Calcium: 9.5 mg/dL (ref 8.9–10.3)
Creatinine, Ser: 0.81 mg/dL (ref 0.44–1.00)
GFR calc Af Amer: >60 mL/min (ref >60)
GFR calc non Af Amer: >60 mL/min (ref >60)
Glucose, Bld: 90 mg/dL (ref 70–99)
POTASSIUM: 3.7 mmol/L (ref 3.5–5.1)
Sodium: 135 mmol/L (ref 135–145)
Total Bilirubin: 1.1 mg/dL (ref 0.3–1.2)
Total Protein: 8.5 g/dL — ABNORMAL HIGH (ref 6.5–8.1)

## 2018-11-21 LAB — I-STAT BETA HCG BLOOD, ED (MC, WL, AP ONLY): HCG, QUANTITATIVE: 357.6 m[IU]/mL — AB (ref <5)

## 2018-11-21 MED ORDER — METRONIDAZOLE 500 MG PO TABS: 500.0000 mg | ORAL_TABLET | Freq: Two times a day (BID) | ORAL | 0 refills | Status: DC

## 2018-11-21 NOTE — ED Provider Notes (Signed)
[facility] EMERGENCY DEPARTMENT Provider Note   CSN: [id number] Arrival date & time: 11/21/18  6147     History   Chief Complaint Chief Complaint  Patient presents with  . Possible Pregnancy    HPI Stephanie Sosa is a 28 y.o. female.  Stephanie Sosa is a 28 y.o. female with a history of eczema, presents to the emergency department for evaluation of vaginal bleeding and possible full pregnancy.  She reports she had multiple positive home pregnancy test on January 5 and last menstrual period was mid November.  She reports that over the past 3 days she has had some light vaginal bleeding, she is unsure if had any vaginal discharge.  She also reports some intermittent lower abdominal cramping which is worse on the left side than the right.  She has not yet had any prenatal care or seen an OB/GYN regarding this pregnancy.  Denies any fevers or chills.  She has had some nausea with 2 episodes of vomiting, last episode of vomiting 3 days ago, no hematemesis.  Denies any lightheadedness or syncope.  No chest pain, shortness of breath or palpitations.  This is her first pregnancy.     Past Medical History:  Diagnosis Date  . Eczema     There are no active problems to display for this patient.   History reviewed. No pertinent surgical history.   OB History    Gravida  1   Para      Term      Preterm      AB      Living        SAB      TAB      Ectopic      Multiple      Live Births               Home Medications    Prior to Admission medications   Medication Sig Start Date End Date Taking? Authorizing Provider  ciprofloxacin (CILOXAN) 0.3 % ophthalmic solution Place 1 drop into both eyes every 2 (two) hours. Administer 1 drop, every 2 hours, while awake, for 2 days. Then 1 drop, every 4 hours, while awake, for the next 5 days. Patient not taking: Reported on 11/21/2018 04/06/15   Muthersbaugh, Dahlia Client, PA-C  lidocaine (XYLOCAINE) 2 %  solution Use as directed 20 mLs in the mouth or throat as needed for mouth pain. Patient not taking: Reported on 11/21/2018 08/04/15   Patel-Mills, Lorelle Formosa, PA-C  naproxen (NAPROSYN) 250 MG tablet Take 1 tablet (250 mg total) by mouth 2 (two) times daily with a meal. Patient not taking: Reported on 11/21/2018 09/05/15   Everlene Farrier, PA-C  ondansetron (ZOFRAN) 4 MG tablet Take 1 tablet (4 mg total) by mouth every 6 (six) hours. Patient not taking: Reported on 11/21/2018 12/11/16   Elpidio Anis, PA-C  oseltamivir (TAMIFLU) 75 MG capsule Take 1 capsule (75 mg total) by mouth every 12 (twelve) hours. Patient not taking: Reported on 11/21/2018 01/18/18   Eyvonne Mechanic, PA-C    Family History No family history on file.  Social History Social History   Tobacco Use  . Smoking status: Never Smoker  . Smokeless tobacco: Never Used  Substance Use Topics  . Alcohol use: No  . Drug use: No    Types: Marijuana     Allergies   Banana   Review of Systems Review of Systems  Constitutional: Negative for chills and fever.  HENT: Negative.  Respiratory: Negative for cough and shortness of breath.   Cardiovascular: Negative for chest pain and leg swelling.  Gastrointestinal: Positive for abdominal pain, nausea and vomiting. Negative for blood in stool, constipation and diarrhea.  Genitourinary: Positive for pelvic pain and vaginal bleeding. Negative for dysuria, frequency, vaginal discharge and vaginal pain.  Musculoskeletal: Negative for arthralgias and myalgias.  Skin: Negative for color change and rash.  Neurological: Negative for dizziness, syncope and light-headedness.  All other systems reviewed and are negative.    Physical Exam Updated Vital Signs BP (!) 132/97 (BP Location: Right Arm)   Pulse 84   Temp 98.3 F (36.8 C) (Oral)   Resp 16   Ht 5\' 4"  (1.626 m)   Wt 66.7 kg   LMP 11/04/2018 (Approximate) Comment: pt reports that she had positive home pregnancy test on 1/5  SpO2  100%   BMI 25.23 kg/m   Physical Exam Vitals signs and nursing note reviewed. Exam conducted with a chaperone present.  Constitutional:      General: She is not in acute distress.    Appearance: Normal appearance. She is well-developed and normal weight. She is not ill-appearing or diaphoretic.  HENT:     Head: Normocephalic and atraumatic.     Mouth/Throat:     Mouth: Mucous membranes are moist.     Pharynx: Oropharynx is clear.  Eyes:     General:        Right eye: No discharge.        Left eye: No discharge.     Pupils: Pupils are equal, round, and reactive to light.  Neck:     Musculoskeletal: Neck supple.  Cardiovascular:     Rate and Rhythm: Normal rate and regular rhythm.     Pulses: Normal pulses.     Heart sounds: Normal heart sounds. No murmur. No friction rub. No gallop.   Pulmonary:     Effort: Pulmonary effort is normal. No respiratory distress.     Breath sounds: Normal breath sounds. No wheezing or rales.     Comments: Respirations equal and unlabored, patient able to speak in full sentences, lungs clear to auscultation bilaterally Abdominal:     General: Abdomen is flat. Bowel sounds are normal. There is no distension.     Palpations: Abdomen is soft. There is no mass.     Tenderness: There is abdominal tenderness. There is no guarding.     Comments: Abdomen is soft, nondistended, bowel sounds present throughout, there is mild tenderness across the lower abdomen but no guarding or rigidity, no rebound tenderness, no peritoneal signs, no CVA tenderness bilaterally.  Genitourinary:    Comments: Chaperone present during pelvic exam. No external genital lesions noted. Speculum exam reveals small amount of bleeding and a small amount of clear discharge. Bimanual exam reveals no cervical motion tenderness, no focal adnexal tenderness or masses bilaterally. Musculoskeletal:        General: No deformity.  Skin:    General: Skin is warm and dry.     Capillary Refill:  Capillary refill takes less than 2 seconds.  Neurological:     Mental Status: She is alert and oriented to person, place, and time. Mental status is at baseline.     Coordination: Coordination normal.  Psychiatric:        Mood and Affect: Mood normal.        Behavior: Behavior normal.      ED Treatments / Results  Labs (all labs ordered are listed, but only  abnormal results are displayed) Labs Reviewed  WET PREP, GENITAL - Abnormal; Notable for the following components:      Result Value   Clue Cells Wet Prep HPF POC PRESENT (*)    WBC, Wet Prep HPF POC FEW (*)    All other components within normal limits  COMPREHENSIVE METABOLIC PANEL - Abnormal; Notable for the following components:   Total Protein 8.5 (*)    All other components within normal limits  CBC WITH DIFFERENTIAL/PLATELET - Abnormal; Notable for the following components:   Platelets 127 (*)    All other components within normal limits  URINALYSIS, ROUTINE W REFLEX MICROSCOPIC - Abnormal; Notable for the following components:   Color, Urine STRAW (*)    Hgb urine dipstick SMALL (*)    Ketones, ur 5 (*)    All other components within normal limits  HCG, QUANTITATIVE, PREGNANCY - Abnormal; Notable for the following components:   hCG, Beta Chain, Quant, S 402 (*)    All other components within normal limits  I-STAT BETA HCG BLOOD, ED ([facility], [facility], [facility] ONLY) - Abnormal; Notable for the following components:   I-stat hCG, quantitative 357.6 (*)    All other components within normal limits  HIV ANTIBODY (ROUTINE TESTING W REFLEX)  RPR  ABO/RH  GC/CHLAMYDIA PROBE AMP ([place]) NOT [facility]    EKG None  Radiology [place] Ob Comp < 14 Wks  Result Date: 11/21/2018 CLINICAL DATA:  First trimester of pregnancy, vaginal bleeding and lower abdominal cramping. EXAM: OBSTETRIC <14 WK US AND TRANSVAGINAL OB US TECHNIQUE: Both transabdominal and transvaginal ultrasound examinations were performed for complete evaluation of the  gestation as well as the maternal uterus, adnexal regions, and pelvic cul-de-sac. Transvaginal technique was performed to assess early pregnancy. COMPARISON:  None. FINDINGS: Intrauterine gestational sac: Not visualized. Yolk sac:  Not visualized. Embryo:  Not visualized. Cardiac Activity: Not visualized. Subchorionic hemorrhage:  None visualized. Maternal uterus/adnexae: Both ovaries appear normal. Mild amount of free fluid is noted which most likely is physiologic. IMPRESSION: No intrauterine gestational sac, yolk sac, fetal pole, or cardiac activity visualized. Differential considerations include intrauterine gestation too early to be sonographically visualized, spontaneous abortion, or ectopic pregnancy. Consider follow-up ultrasound in 14 days and serial quantitative beta HCG follow-up. Electronically Signed   By: Lupita RaiderJames  Green Jr, M.D.   On: 11/21/2018 12:03   [place] Ob Transvaginal  Result Date: 11/21/2018 CLINICAL DATA:  First trimester of pregnancy, vaginal bleeding and lower abdominal cramping. EXAM: OBSTETRIC <14 WK US AND TRANSVAGINAL OB US TECHNIQUE: Both transabdominal and transvaginal ultrasound examinations were performed for complete evaluation of the gestation as well as the maternal uterus, adnexal regions, and pelvic cul-de-sac. Transvaginal technique was performed to assess early pregnancy. COMPARISON:  None. FINDINGS: Intrauterine gestational sac: Not visualized. Yolk sac:  Not visualized. Embryo:  Not visualized. Cardiac Activity: Not visualized. Subchorionic hemorrhage:  None visualized. Maternal uterus/adnexae: Both ovaries appear normal. Mild amount of free fluid is noted which most likely is physiologic. IMPRESSION: No intrauterine gestational sac, yolk sac, fetal pole, or cardiac activity visualized. Differential considerations include intrauterine gestation too early to be sonographically visualized, spontaneous abortion, or ectopic pregnancy. Consider follow-up ultrasound in 14 days and  serial quantitative beta HCG follow-up. Electronically Signed   By: Lupita RaiderJames  Green Jr, M.D.   On: 11/21/2018 12:03    Procedures Procedures (including critical care time)  Medications Ordered in ED Medications - No data to display   Initial Impression / Assessment and Plan /  ED Course  I have reviewed the triage vital signs and the nursing notes.  Pertinent labs & imaging results that were available during my care of the patient were reviewed by me and considered in my medical decision making (see chart for details).  Patient presents for evaluation of vaginal bleeding, with possible pregnancy.  Positive home pregnancy test on January 5, last menstrual period in mid November.  3 days of light vaginal bleeding with some intermittent cramping worse on left than right.  No prior prenatal care has not had IUP confirmed yet.  No syncope or lightheadedness, on arrival patient appears well with normal vitals.  Abdominal exam is benign with some mild lower abdominal tenderness but no guarding.  Pelvic exam reveals a small amount of bleeding and discharge, no focal adnexal tenderness or masses.  STD testing collected.  Will also get basic labs, quantitative hCG and ABO blood type.  Pelvic ultrasound ordered to rule out ectopic pregnancy.  I-STAT hCG positive at 357.6, although this is lower than I would expect it to be for her last menstrual period of mid November, quantitative ordered.  No leukocytosis and normal hemoglobin, no acute electrolyte derangements requiring intervention, normal renal and liver function.  Urinalysis without signs of infection.  STD testing collected.  Wet prep consistent with BV, with minimal concern for other STDs.  Quantitative hCG similar at 402, awaiting ultrasound results.  Patient is Rh+, no need for RhoGam.  Ultrasound shows no evidence of intrauterine gestational sac, yolk sac, fetal pole or cardiac activity, differential includes spontaneous abortion versus intrauterine  gestation too early for sonographic visualization.  No obvious ectopic pregnancy visualized and only a minimal amount of free fluid present.  Discussed results of work-up with patient and that she will need to follow-up with [facility] or women's clinic in 2 days for repeat hCG level and this will help [place] determine whether this is an early gestation versus spontaneous abortion.  Patient expresses understanding and agreement with this plan.  Will be treated with Flagyl for BV.  She is aware she has STD testing collected and will be contacted with any positive results and will need to follow-up for treatment and notify any partners.  I have discussed appropriate return precautions.  Patient expresses understanding and agreement with plan.  Stable for discharge home at this time.  Final Clinical Impressions(s) / ED Diagnoses   Final diagnoses:  Vaginal bleeding in pregnancy, first trimester  BV (bacterial vaginosis)    ED Discharge Orders         Ordered    metroNIDAZOLE (FLAGYL) 500 MG tablet  2 times daily,   Status:  Discontinued     11/21/18 1322    metroNIDAZOLE (FLAGYL) 500 MG tablet  2 times daily     11/21/18 1327           Dartha Lodge, [place] 11/21/18 1346    Linwood Dibbles, MD 11/23/18 [phone]

## 2018-11-21 NOTE — ED Triage Notes (Signed)
Pt states that she found out she was pregnant on January 5th and has not had "medical confirmation of pregnany and to find out how far along I am".  Pt reports that her last period was in November and she has been having vaginal bleeding since Monday.  Pt has also been having some cramping.

## 2018-11-21 NOTE — Discharge Instructions (Signed)
Your ultrasound did not show an intrauterine pregnancy, your hCG level was elevated at 402, this could either mean that you have had a miscarriage and your hCG levels are falling or that you have an extremely early pregnancy that is too early to view on ultrasound.  You will need to follow-up at the [facility] in 2 days for repeat hCG level this will help us determine between these 2 options.  Your wet prep showed evidence of BV, please take Flagyl twice daily as directed.  You had STD testing pending will be contacted in 2 to 3 days with any positive results, if so you can report to OB/GYN or health department for treatment you will need to notify partner so they can be tested and treated as well.  Return for worsening abdominal pain, increased vaginal bleeding or any other new or concerning symptoms.

## 2018-11-22 LAB — GC/CHLAMYDIA PROBE AMP (~~LOC~~) NOT AT ARMC
CHLAMYDIA, DNA PROBE: NEGATIVE
Neisseria Gonorrhea: NEGATIVE

## 2018-11-22 LAB — HIV ANTIBODY (ROUTINE TESTING W REFLEX): HIV Screen 4th Generation wRfx: NONREACTIVE

## 2018-11-22 LAB — RPR: RPR: NONREACTIVE

## 2018-11-24 ENCOUNTER — Encounter (HOSPITAL_COMMUNITY): Payer: Self-pay | Admitting: *Deleted

## 2018-11-24 ENCOUNTER — Inpatient Hospital Stay (HOSPITAL_COMMUNITY)
Admission: AD | Admit: 2018-11-24 | Discharge: 2018-11-24 | Disposition: A | Discharge status: Home / Self Care | Payer: 59 | Attending: Obstetrics & Gynecology | Admitting: Obstetrics & Gynecology

## 2018-11-24 DIAGNOSIS — O039 Complete or unspecified spontaneous abortion without complication: Secondary | ICD-10-CM

## 2018-11-24 LAB — COMPREHENSIVE METABOLIC PANEL
ALK PHOS: 43 U/L (ref 38–126)
ALT: 12 U/L (ref 0–44)
ANION GAP: 8 (ref 5–15)
AST: 15 U/L (ref 15–41)
Albumin: 4.6 g/dL (ref 3.5–5.0)
BUN: 11 mg/dL (ref 6–20)
CALCIUM: 9.6 mg/dL (ref 8.9–10.3)
CHLORIDE: 105 mmol/L (ref 98–111)
CO2: 24 mmol/L (ref 22–32)
CREATININE: 0.8 mg/dL (ref 0.44–1.00)
Glucose, Bld: 87 mg/dL (ref 70–99)
Potassium: 4.1 mmol/L (ref 3.5–5.1)
Sodium: 137 mmol/L (ref 135–145)
Total Bilirubin: 1.2 mg/dL (ref 0.3–1.2)
Total Protein: 8.8 g/dL — ABNORMAL HIGH (ref 6.5–8.1)

## 2018-11-24 LAB — URINALYSIS, ROUTINE W REFLEX MICROSCOPIC
BACTERIA UA: NONE SEEN
Bilirubin Urine: NEGATIVE
GLUCOSE, UA: NEGATIVE mg/dL
KETONES UR: NEGATIVE mg/dL
Leukocytes, UA: NEGATIVE
NITRITE: NEGATIVE
PROTEIN: 100 mg/dL — AB
Specific Gravity, Urine: 1.029 (ref 1.005–1.030)
pH: 5 (ref 5.0–8.0)

## 2018-11-24 LAB — CBC
HCT: 38.8 % (ref 36.0–46.0)
Hemoglobin: 12.7 g/dL (ref 12.0–15.0)
MCH: 30.8 pg (ref 26.0–34.0)
MCHC: 32.7 g/dL (ref 30.0–36.0)
MCV: 93.9 fL (ref 80.0–100.0)
Platelets: 148 10*3/uL — ABNORMAL LOW (ref 150–400)
RBC: 4.13 MIL/uL (ref 3.87–5.11)
RDW: 14.1 % (ref 11.5–15.5)
WBC: 7.2 10*3/uL (ref 4.0–10.5)
nRBC: 0 % (ref 0.0–0.2)

## 2018-11-24 LAB — HCG, QUANTITATIVE, PREGNANCY: HCG, BETA CHAIN, QUANT, S: 341 m[IU]/mL — AB (ref <5)

## 2018-11-24 MED ORDER — KETOROLAC TROMETHAMINE 60 MG/2ML IM SOLN
60.0000 mg | Freq: Once | INTRAMUSCULAR | Status: AC
  Administered 2018-11-24: 60 mg via INTRAMUSCULAR
  Filled 2018-11-24: qty 2

## 2018-11-24 MED ORDER — IBUPROFEN 800 MG PO TABS: 800.0000 mg | ORAL_TABLET | Freq: Three times a day (TID) | ORAL | 0 refills | Status: DC

## 2018-11-24 NOTE — [facility]U Note (Signed)
Pt reports lower abd pain, lower back pain, and vaginal bleeding for a week. All symptoms are worsening.

## 2018-11-24 NOTE — MAU Provider Note (Signed)
Patient Stephanie Sosa is a 28 y.o. G1P0 At Unknown gestation here with pain and bleeding. Stephanie Sosa was seen in [facility] ED on 1/13 and was told Stephanie Sosa was having an early miscarriage or pregnancy of unknown location. Stephanie Sosa was told to follow-up here at [facility]. Stephanie Sosa waited three days and then came in to be seen.  Stephanie Sosa denies dysuria, other abnormal discharge, NV, or other ob-gyn complaints.  History     CSN: [id number]  Arrival date and time: 11/24/18 1245   First Provider Initiated Contact with Patient 11/24/18 1403      Chief Complaint  Patient presents with  . Abdominal Pain  . Vaginal Bleeding   Abdominal Pain  This is a new problem. The current episode started in the past 7 days. The problem occurs intermittently. The problem has been unchanged. The pain is located in the suprapubic region. The pain is at a severity of 7/10. The quality of the pain is cramping. The abdominal pain does not radiate. Pertinent negatives include no diarrhea, nausea or vomiting. Nothing aggravates the pain. The pain is relieved by nothing.  Vaginal Bleeding  Associated symptoms include abdominal pain. Pertinent negatives include no diarrhea, nausea or vomiting.    OB History    Gravida  1   Para      Term      Preterm      AB      Living        SAB      TAB      Ectopic      Multiple      Live Births              Past Medical History:  Diagnosis Date  . Eczema     Past Surgical History:  Procedure Laterality Date  . NO PAST SURGERIES      History reviewed. No pertinent family history.  Social History   Tobacco Use  . Smoking status: Never Smoker  . Smokeless tobacco: Never Used  Substance Use Topics  . Alcohol use: No  . Drug use: No    Types: Marijuana    Allergies:  Allergies  Allergen Reactions  . Banana Anaphylaxis    Medications Prior to Admission  Medication Sig Dispense Refill Last Dose  . ciprofloxacin (CILOXAN) 0.3 % ophthalmic solution  Place 1 drop into both eyes every 2 (two) hours. Administer 1 drop, every 2 hours, while awake, for 2 days. Then 1 drop, every 4 hours, while awake, for the next 5 days. (Patient not taking: Reported on 11/21/2018) 5 mL 0 Not Taking at Unknown time  . lidocaine (XYLOCAINE) 2 % solution Use as directed 20 mLs in the mouth or throat as needed for mouth pain. (Patient not taking: Reported on 11/21/2018) 100 mL 0 Not Taking at Unknown time  . metroNIDAZOLE (FLAGYL) 500 MG tablet Take 1 tablet (500 mg total) by mouth 2 (two) times daily. One po bid x 7 days 14 tablet 0   . naproxen (NAPROSYN) 250 MG tablet Take 1 tablet (250 mg total) by mouth 2 (two) times daily with a meal. (Patient not taking: Reported on 11/21/2018) 30 tablet 0 Not Taking at Unknown time  . ondansetron (ZOFRAN) 4 MG tablet Take 1 tablet (4 mg total) by mouth every 6 (six) hours. (Patient not taking: Reported on 11/21/2018) 12 tablet 0 Not Taking at Unknown time  . oseltamivir (TAMIFLU) 75 MG capsule Take 1 capsule (75 mg total) by mouth every  12 (twelve) hours. (Patient not taking: Reported on 11/21/2018) 10 capsule 0 Not Taking at Unknown time    Review of Systems  Gastrointestinal: Positive for abdominal pain. Negative for diarrhea, nausea and vomiting.  Genitourinary: Positive for vaginal bleeding.   Physical Exam   Blood pressure 110/80, pulse 80, temperature 98.6 F (37 C), temperature source Oral, resp. rate 15, height 5' 4.5" (1.638 m), weight 68 kg, last menstrual period 11/04/2018, SpO2 99 %.  Physical Exam  Constitutional: Stephanie Sosa is oriented to person, place, and time. Stephanie Sosa appears well-developed.  HENT:  Head: Normocephalic.  Neck: Normal range of motion.  Respiratory: Effort normal.  GI: Soft.  Genitourinary:    Genitourinary Comments: NEFG; no lesions on external vaginal walls. Vaginal walls smooth, dark red blood extruding from the os; no POC. Bimanual exam deferred.    Musculoskeletal: Normal range of motion.   Neurological: Stephanie Sosa is alert and oriented to person, place, and time.  Skin: Skin is warm and dry.  Psychiatric: Stephanie Sosa has a normal mood and affect.    MAU Course  Procedures  MDM -beta, CBC, CMP today. CBC is normal, beta is now 341, drop from 403 three days ago.  -reviewed US from 1/15; no gestational sac visualized, no free fluid in pelvis at that visit. US not repeated today.   Consulted with Dr. Debroah LoopArnold, who finds drop in beta reassuring and that patient can have non-stat beta in one week and follow-up with provider in two weeks.   Before her beta hcg results were back, patient requested toradol for pain; I explained that it would be contraindicated in 1st trimester but patient still wanted the injection.  Assessment and Plan   1. Miscarriage    2. Explained diagnosis of miscarriage to patient and that Stephanie Sosa may have more bleeding and clots.   3. Appt made for patient to have follow up non-stat beta in 1 week and message sent to clinic to schedule appt with any  Provider to talk about birth contol and SAB follow up.   4. Discussed bleeding precautions and when to return to [facility]U.   4. Patient verbalized understanding. All questions answered.  Charlesetta GaribaldiKathryn Lorraine Quentavious Rittenhouse 11/24/2018, 2:14 PM

## 2018-11-24 NOTE — Discharge Instructions (Signed)
Miscarriage  A miscarriage is the loss of an unborn baby (fetus) before the 20th week of pregnancy.  Follow these instructions at home:  Medicines    · Take over-the-counter and prescription medicines only as told by your doctor.  · If you were prescribed antibiotic medicine, take it as told by your doctor. Do not stop taking the antibiotic even if you start to feel better.  · Do not take NSAIDs unless your doctor says that this is safe for you. NSAIDs include aspirin and ibuprofen. These medicines can cause bleeding.  Activity  · Rest as directed. Ask your doctor what activities are safe for you.  · Have someone help you at home during this time.  General instructions  · Write down how many pads you use each day and how soaked they are.  · Watch the amount of tissue or clumps of blood (blood clots) that you pass from your vagina. Save any large amounts of tissue for your doctor.  · Do not use tampons, douche, or have sex until your doctor approves.  · To help you and your partner with the process of grieving, talk with your doctor or seek counseling.  · When you are ready, meet with your doctor to talk about steps you should take for your health. Also, talk with your doctor about steps to take to have a healthy pregnancy in the future.  · Keep all follow-up visits as told by your doctor. This is important.  Contact a doctor if:  · You have a fever or chills.  · You have vaginal discharge that smells bad.  · You have more bleeding.  Get help right away if:  · You have very bad cramps or pain in your back or belly.  · You pass clumps of blood that are walnut-sized or larger from your vagina.  · You pass tissue that is walnut-sized or larger from your vagina.  · You soak more than 1 regular pad in an hour.  · You get light-headed or weak.  · You faint (pass out).  · You have feelings of sadness that do not go away, or you have thoughts of hurting yourself.  Summary  · A miscarriage is the loss of an unborn baby before  the 20th week of pregnancy.  · Follow your doctor's instructions for home care. Keep all follow-up appointments.  · To help you and your partner with the process of grieving, talk with your doctor or seek counseling.  This information is not intended to replace advice given to you by your health care provider. Make sure you discuss any questions you have with your health care provider.  Document Released: 01/18/2012 Document Revised: 12/01/2016 Document Reviewed: 12/01/2016  [employer]vier Interactive Patient Education © 2019 [employer]

## 2018-12-01 ENCOUNTER — Other Ambulatory Visit (INDEPENDENT_AMBULATORY_CARE_PROVIDER_SITE_OTHER): Payer: 59

## 2018-12-01 DIAGNOSIS — O3680X0 Pregnancy with inconclusive fetal viability, not applicable or unspecified: Secondary | ICD-10-CM | POA: Diagnosis not present

## 2018-12-01 DIAGNOSIS — O039 Complete or unspecified spontaneous abortion without complication: Secondary | ICD-10-CM | POA: Diagnosis not present

## 2018-12-01 DIAGNOSIS — Z3009 Encounter for other general counseling and advice on contraception: Secondary | ICD-10-CM

## 2018-12-01 NOTE — Progress Notes (Signed)
Obstetrics and Gynecology Visit Return Patient Evaluation  Appointment Date: 12/01/2018  Primary Care Provider: Angelica Sosa  OBGYN Clinic: [facility]  Chief Complaint: contraception management. Follow up pregnancy of unknown location  History of Present Illness:  Stephanie Sosa is a 28 y.o. G1P0010 who went to the MAU on 1/16 for follow up 1/13 ED visit. On 1/13 her beta was 401 and she had a negative u/s. She was seen in the MAU for beta f/u on 1/16 and her beta was 341. She was set up for a clinic visit today for beta follow up and contraception management.   No pain, fevers, chills, with scant VB  Review of Systems:  as noted in the History of Present Illness.  Medications:  Julieta Bellini had no medications administered during this visit. Current Outpatient Medications  Medication Sig Dispense Refill  . ibuprofen (ADVIL,MOTRIN) 800 MG tablet Take 1 tablet (800 mg total) by mouth 3 (three) times daily. 21 tablet 0  . metroNIDAZOLE (FLAGYL) 500 MG tablet Take 1 tablet (500 mg total) by mouth 2 (two) times daily. One po bid x 7 days 14 tablet 0   No current facility-administered medications for this visit.     Allergies: is allergic to banana.  Physical Exam:  LMP 11/04/2018 (Approximate) Comment: pt reports that she had positive home pregnancy test on 1/5 There is no height or weight on file to calculate BMI. General appearance: Well nourished, well developed female in no acute distress.  Abdomen: diffusely non tender to palpation, non distended, and no masses, hernias Neuro/Psych:  Normal mood and affect.    Beta hcg: 38   Assessment: pt stable  Plan:  1. Miscarriage D/w her most likely SAB but since never confirmed an IUP, ectopic precautions given and will see her back next week for a non stat beta.  - Beta hCG quant (ref lab)  2. Contraception management Options d/w her and I told her that she can have any option except for IUD  which can't be done until her beta is negative. Pt to consider options and pt told that if wanted IUD to make follow up appt after next beta visit in a week, since I suspect it'll be zero by then but if she wants something else to make visit as same day as rpt beta next week - Beta hCG quant (ref lab); Future   RTC: 1wk   Cornelia Copa MD Attending [facility])

## 2018-12-01 NOTE — Progress Notes (Signed)
beta

## 2018-12-02 LAB — BETA HCG QUANT (REF LAB): hCG Quant: 38 m[IU]/mL

## 2018-12-09 ENCOUNTER — Encounter: Payer: Self-pay | Admitting: Obstetrics and Gynecology

## 2018-12-09 ENCOUNTER — Other Ambulatory Visit: Payer: 59

## 2018-12-09 ENCOUNTER — Ambulatory Visit (INDEPENDENT_AMBULATORY_CARE_PROVIDER_SITE_OTHER): Payer: 59 | Admitting: Obstetrics and Gynecology

## 2018-12-09 VITALS — BP 128/86 | HR 90 | Ht 64.5 in | Wt 146.9 lb

## 2018-12-09 DIAGNOSIS — O039 Complete or unspecified spontaneous abortion without complication: Secondary | ICD-10-CM

## 2018-12-09 DIAGNOSIS — O3680X0 Pregnancy with inconclusive fetal viability, not applicable or unspecified: Secondary | ICD-10-CM

## 2018-12-09 MED ORDER — DESOGESTREL-ETHINYL ESTRADIOL 0.15-30 MG-MCG PO TABS: 1.0000 | ORAL_TABLET | Freq: Every day | ORAL | 11 refills | Status: DC

## 2018-12-09 NOTE — Patient Instructions (Signed)
Contraception Choices Contraception, also called birth control, means things to use or ways to try not to get pregnant. Hormonal birth control This kind of birth control uses hormones. Here are some types of hormonal birth control:  A tube that is put under skin of the arm (implant). The tube can stay in for as long as 3 years.  Shots to get every 3 months (injections).  Pills to take every day (birth control pills).  A patch to change 1 time each week for 3 weeks (birth control patch). After that, the patch is taken off for 1 week.  A ring to put in the vagina. The ring is left in for 3 weeks. Then it is taken out of the vagina for 1 week. Then a new ring is put in.  Pills to take after unprotected sex (emergency birth control pills). Barrier birth control Here are some types of barrier birth control:  A thin covering that is put on the penis before sex (female condom). The covering is thrown away after sex.  A soft, loose covering that is put in the vagina before sex (female condom). The covering is thrown away after sex.  A rubber bowl that sits over the cervix (diaphragm). The bowl must be made for you. The bowl is put into the vagina before sex. The bowl is left in for 6-8 hours after sex. It is taken out within 24 hours.  A small, soft cup that fits over the cervix (cervical cap). The cup must be made for you. The cup can be left in for 6-8 hours after sex. It is taken out within 48 hours.  A sponge that is put into the vagina before sex. It must be left in for at least 6 hours after sex. It must be taken out within 30 hours. Then it is thrown away.  A chemical that kills or stops sperm from getting into the uterus (spermicide). It may be a pill, cream, jelly, or foam to put in the vagina. The chemical should be used at least 10-15 minutes before sex. IUD (intrauterine) birth control An IUD is a small, T-shaped piece of plastic. It is put inside the uterus. There are two  kinds:  Hormone IUD. This kind can stay in for 3-5 years.  Copper IUD. This kind can stay in for 10 years. Permanent birth control Here are some types of permanent birth control:  Surgery to block the fallopian tubes.  Having an insert put into each fallopian tube.  Surgery to tie off the tubes that carry sperm (vasectomy). Natural planning birth control Here are some types of natural planning birth control:  Not having sex on the days the woman could get pregnant.  Using a calendar: ? To keep track of the length of each period. ? To find out what days pregnancy can happen. ? To plan to not have sex on days when pregnancy can happen.  Watching for symptoms of ovulation and not having sex during ovulation. One way the woman can check for ovulation is to check her temperature.  Waiting to have sex until after ovulation. Summary  Contraception, also called birth control, means things to use or ways to try not to get pregnant.  Hormonal methods of birth control include implants, injections, pills, patches, vaginal rings, and emergency birth control pills.  Barrier methods of birth control can include female condoms, female condoms, diaphragms, cervical caps, sponges, and spermicides.  There are two types of IUD (intrauterine device) birth control.  An IUD can be put in a woman's uterus to prevent pregnancy for 3-5 years.  Permanent sterilization can be done through a procedure for males, females, or both.  Natural planning methods involve not having sex on the days when the woman could get pregnant. This information is not intended to replace advice given to you by your health care provider. Make sure you discuss any questions you have with your health care provider. Document Released: 08/23/2009 Document Revised: 06/02/2018 Document Reviewed: 11/05/2016 Elsevier Interactive Patient Education  2019 [employer] Maintenance, Female Adopting a healthy lifestyle and getting  preventive care can go a long way to promote health and wellness. Talk with your health care provider about what schedule of regular examinations is right for you. This is a good chance for you to check in with your provider about disease prevention and staying healthy. In between checkups, there are plenty of things you can do on your own. Experts have done a lot of research about which lifestyle changes and preventive measures are most likely to keep you healthy. Ask your health care provider for more information. Weight and diet Eat a healthy diet  Be sure to include plenty of vegetables, fruits, low-fat dairy products, and lean protein.  Do not eat a lot of foods high in solid fats, added sugars, or salt.  Get regular exercise. This is one of the most important things you can do for your health. ? Most adults should exercise for at least 150 minutes each week. The exercise should increase your heart rate and make you sweat (moderate-intensity exercise). ? Most adults should also do strengthening exercises at least twice a week. This is in addition to the moderate-intensity exercise. Maintain a healthy weight  Body mass index (BMI) is a measurement that can be used to identify possible weight problems. It estimates body fat based on height and weight. Your health care provider can help determine your BMI and help you achieve or maintain a healthy weight.  For females 71 years of age and older: ? A BMI below 18.5 is considered underweight. ? A BMI of 18.5 to 24.9 is normal. ? A BMI of 25 to 29.9 is considered overweight. ? A BMI of 30 and above is considered obese. Watch levels of cholesterol and blood lipids  You should start having your blood tested for lipids and cholesterol at 26 years of age, then have this test every 5 years.  You may need to have your cholesterol levels checked more often if: ? Your lipid or cholesterol levels are high. ? You are older than 26 years of age. ? You  are at high risk for heart disease. Cancer screening Lung Cancer  Lung cancer screening is recommended for adults 68-81 years old who are at high risk for lung cancer because of a history of smoking.  A yearly low-dose CT scan of the lungs is recommended for people who: ? Currently smoke. ? Have quit within the past 15 years. ? Have at least a 30-pack-year history of smoking. A pack year is smoking an average of one pack of cigarettes a day for 1 year.  Yearly screening should continue until it has been 15 years since you quit.  Yearly screening should stop if you develop a health problem that would prevent you from having lung cancer treatment. Breast Cancer  Practice breast self-awareness. This means understanding how your breasts normally appear and feel.  It also means doing regular breast self-exams. Let your health  care provider know about any changes, no matter how small.  If you are in your 20s or 30s, you should have a clinical breast exam (CBE) by a health care provider every 1-3 years as part of a regular health exam.  If you are 78 or older, have a CBE every year. Also consider having a breast X-ray (mammogram) every year.  If you have a family history of breast cancer, talk to your health care provider about genetic screening.  If you are at high risk for breast cancer, talk to your health care provider about having an MRI and a mammogram every year.  Breast cancer gene (BRCA) assessment is recommended for women who have family members with BRCA-related cancers. BRCA-related cancers include: ? Breast. ? Ovarian. ? Tubal. ? Peritoneal cancers.  Results of the assessment will determine the need for genetic counseling and BRCA1 and BRCA2 testing. Cervical Cancer Your health care provider may recommend that you be screened regularly for cancer of the pelvic organs (ovaries, uterus, and vagina). This screening involves a pelvic examination, including checking for  microscopic changes to the surface of your cervix (Pap test). You may be encouraged to have this screening done every 3 years, beginning at age 79.  For women ages 68-65, health care providers may recommend pelvic exams and Pap testing every 3 years, or they may recommend the Pap and pelvic exam, combined with testing for human papilloma virus (HPV), every 5 years. Some types of HPV increase your risk of cervical cancer. Testing for HPV may also be done on women of any age with unclear Pap test results.  Other health care providers may not recommend any screening for nonpregnant women who are considered low risk for pelvic cancer and who do not have symptoms. Ask your health care provider if a screening pelvic exam is right for you.  If you have had past treatment for cervical cancer or a condition that could lead to cancer, you need Pap tests and screening for cancer for at least 20 years after your treatment. If Pap tests have been discontinued, your risk factors (such as having a new sexual partner) need to be reassessed to determine if screening should resume. Some women have medical problems that increase the chance of getting cervical cancer. In these cases, your health care provider may recommend more frequent screening and Pap tests. Colorectal Cancer  This type of cancer can be detected and often prevented.  Routine colorectal cancer screening usually begins at 26 years of age and continues through 26 years of age.  Your health care provider may recommend screening at an earlier age if you have risk factors for colon cancer.  Your health care provider may also recommend using home test kits to check for hidden blood in the stool.  A small camera at the end of a tube can be used to examine your colon directly (sigmoidoscopy or colonoscopy). This is done to check for the earliest forms of colorectal cancer.  Routine screening usually begins at age 75.  Direct examination of the colon  should be repeated every 5-10 years through 26 years of age. However, you may need to be screened more often if early forms of precancerous polyps or small growths are found. Skin Cancer  Check your skin from head to toe regularly.  Tell your health care provider about any new moles or changes in moles, especially if there is a change in a mole's shape or color.  Also tell your health care  provider if you have a mole that is larger than the size of a pencil eraser.  Always use sunscreen. Apply sunscreen liberally and repeatedly throughout the day.  Protect yourself by wearing long sleeves, pants, a wide-brimmed hat, and sunglasses whenever you are outside. Heart disease, diabetes, and high blood pressure  High blood pressure causes heart disease and increases the risk of stroke. High blood pressure is more likely to develop in: ? People who have blood pressure in the high end of the normal range (130-139/85-89 mm Hg). ? People who are overweight or obese. ? People who are African American.  If you are 70-73 years of age, have your blood pressure checked every 3-5 years. If you are 72 years of age or older, have your blood pressure checked every year. You should have your blood pressure measured twice-once when you are at a hospital or clinic, and once when you are not at a hospital or clinic. Record the average of the two measurements. To check your blood pressure when you are not at a hospital or clinic, you can use: ? An automated blood pressure machine at a pharmacy. ? A home blood pressure monitor.  If you are between 64 years and 54 years old, ask your health care provider if you should take aspirin to prevent strokes.  Have regular diabetes screenings. This involves taking a blood sample to check your fasting blood sugar level. ? If you are at a normal weight and have a low risk for diabetes, have this test once every three years after 26 years of age. ? If you are overweight and  have a high risk for diabetes, consider being tested at a younger age or more often. Preventing infection Hepatitis B  If you have a higher risk for hepatitis B, you should be screened for this virus. You are considered at high risk for hepatitis B if: ? You were born in a country where hepatitis B is common. Ask your health care provider which countries are considered high risk. ? Your parents were born in a high-risk country, and you have not been immunized against hepatitis B (hepatitis B vaccine). ? You have HIV or AIDS. ? You use needles to inject street drugs. ? You live with someone who has hepatitis B. ? You have had sex with someone who has hepatitis B. ? You get hemodialysis treatment. ? You take certain medicines for conditions, including cancer, organ transplantation, and autoimmune conditions. Hepatitis C  Blood testing is recommended for: ? Everyone born from [birth year] through 1963. ? Anyone with known risk factors for hepatitis C. Sexually transmitted infections (STIs)  You should be screened for sexually transmitted infections (STIs) including gonorrhea and chlamydia if: ? You are sexually active and are younger than 26 years of age. ? You are older than 26 years of age and your health care provider tells you that you are at risk for this type of infection. ? Your sexual activity has changed since you were last screened and you are at an increased risk for chlamydia or gonorrhea. Ask your health care provider if you are at risk.  If you do not have HIV, but are at risk, it may be recommended that you take a prescription medicine daily to prevent HIV infection. This is called pre-exposure prophylaxis (PrEP). You are considered at risk if: ? You are sexually active and do not regularly use condoms or know the HIV status of your partner(s). ? You take drugs by injection. ?  You are sexually active with a partner who has HIV. Talk with your health care provider about whether you  are at high risk of being infected with HIV. If you choose to begin PrEP, you should first be tested for HIV. You should then be tested every 3 months for as long as you are taking PrEP. Pregnancy  If you are premenopausal and you may become pregnant, ask your health care provider about preconception counseling.  If you may become pregnant, take 400 to 800 micrograms (mcg) of folic acid every day.  If you want to prevent pregnancy, talk to your health care provider about birth control (contraception). Osteoporosis and menopause  Osteoporosis is a disease in which the bones lose minerals and strength with aging. This can result in serious bone fractures. Your risk for osteoporosis can be identified using a bone density scan.  If you are 60 years of age or older, or if you are at risk for osteoporosis and fractures, ask your health care provider if you should be screened.  Ask your health care provider whether you should take a calcium or vitamin D supplement to lower your risk for osteoporosis.  Menopause may have certain physical symptoms and risks.  Hormone replacement therapy may reduce some of these symptoms and risks. Talk to your health care provider about whether hormone replacement therapy is right for you. Follow these instructions at home:  Schedule regular health, dental, and eye exams.  Stay current with your immunizations.  Do not use any tobacco products including cigarettes, chewing tobacco, or electronic cigarettes.  If you are pregnant, do not drink alcohol.  If you are breastfeeding, limit how much and how often you drink alcohol.  Limit alcohol intake to no more than 1 drink per day for nonpregnant women. One drink equals 12 ounces of beer, 5 ounces of wine, or 1 ounces of hard liquor.  Do not use street drugs.  Do not share needles.  Ask your health care provider for help if you need support or information about quitting drugs.  Tell your health care  provider if you often feel depressed.  Tell your health care provider if you have ever been abused or do not feel safe at home. This information is not intended to replace advice given to you by your health care provider. Make sure you discuss any questions you have with your health care provider. Document Released: 05/11/2011 Document Revised: 04/02/2016 Document Reviewed: 07/30/2015 Elsevier Interactive Patient Education  2019 [employer].

## 2018-12-09 NOTE — Progress Notes (Signed)
Pt wants Birth Control Pills 

## 2018-12-10 LAB — BETA HCG QUANT (REF LAB): hCG Quant: 4 m[IU]/mL

## 2018-12-13 ENCOUNTER — Telehealth: Payer: Self-pay

## 2018-12-13 NOTE — Telephone Encounter (Signed)
-----   Message from [place] Bing, MD sent at 12/12/2018  1:04 PM EST ----- hcg is normal. Can you let her know to expect a period in about a month and if she doesn't get one by 8 wks to call us for an appointment

## 2018-12-13 NOTE — Telephone Encounter (Signed)
LVM for patient to call back for test results  

## 2018-12-14 ENCOUNTER — Telehealth: Payer: Self-pay | Admitting: *Deleted

## 2018-12-14 NOTE — Telephone Encounter (Signed)
LVM for pateint to call back for results of Bhcg quant.

## 2018-12-14 NOTE — Telephone Encounter (Signed)
Kandice left a voice message that she received a call about results and is calling back to get them.

## 2018-12-20 NOTE — Telephone Encounter (Signed)
Called pt and left message stating this is a second attempt to reach her regarding test results which she requested. If she still has questions she may call back and leave a new message.

## 2019-06-12 ENCOUNTER — Other Ambulatory Visit: Payer: Self-pay

## 2019-06-12 ENCOUNTER — Encounter (HOSPITAL_COMMUNITY): Payer: Self-pay

## 2019-06-12 ENCOUNTER — Emergency Department (HOSPITAL_COMMUNITY)
Admission: EM | Admit: 2019-06-12 | Discharge: 2019-06-13 | Disposition: A | Discharge status: Home / Self Care | Payer: Medicaid Other | Attending: Emergency Medicine | Admitting: Emergency Medicine

## 2019-06-12 DIAGNOSIS — B9689 Other specified bacterial agents as the cause of diseases classified elsewhere: Secondary | ICD-10-CM | POA: Diagnosis not present

## 2019-06-12 DIAGNOSIS — N939 Abnormal uterine and vaginal bleeding, unspecified: Secondary | ICD-10-CM

## 2019-06-12 DIAGNOSIS — Z79899 Other long term (current) drug therapy: Secondary | ICD-10-CM | POA: Insufficient documentation

## 2019-06-12 DIAGNOSIS — N760 Acute vaginitis: Secondary | ICD-10-CM | POA: Insufficient documentation

## 2019-06-12 DIAGNOSIS — F17210 Nicotine dependence, cigarettes, uncomplicated: Secondary | ICD-10-CM | POA: Diagnosis not present

## 2019-06-12 LAB — CBC WITH DIFFERENTIAL/PLATELET
Abs Immature Granulocytes: 0.01 10*3/uL (ref 0.00–0.07)
Basophils Absolute: 0 10*3/uL (ref 0.0–0.1)
Basophils Relative: 0 %
Eosinophils Absolute: 0 10*3/uL (ref 0.0–0.5)
Eosinophils Relative: 0 %
HCT: 39.9 % (ref 36.0–46.0)
Hemoglobin: 12.8 g/dL (ref 12.0–15.0)
Immature Granulocytes: 0 %
Lymphocytes Relative: 18 %
Lymphs Abs: 1.7 10*3/uL (ref 0.7–4.0)
MCH: 31.1 pg (ref 26.0–34.0)
MCHC: 32.1 g/dL (ref 30.0–36.0)
MCV: 96.8 fL (ref 80.0–100.0)
Monocytes Absolute: 0.4 10*3/uL (ref 0.1–1.0)
Monocytes Relative: 5 %
Neutro Abs: 7.5 10*3/uL (ref 1.7–7.7)
Neutrophils Relative %: 77 %
Platelets: 190 10*3/uL (ref 150–400)
RBC: 4.12 MIL/uL (ref 3.87–5.11)
RDW: 12.5 % (ref 11.5–15.5)
WBC: 9.8 10*3/uL (ref 4.0–10.5)
nRBC: 0 % (ref 0.0–0.2)

## 2019-06-12 LAB — BASIC METABOLIC PANEL
Anion gap: 8 (ref 5–15)
BUN: 5 mg/dL — ABNORMAL LOW (ref 6–20)
CO2: 24 mmol/L (ref 22–32)
Calcium: 9.7 mg/dL (ref 8.9–10.3)
Chloride: 107 mmol/L (ref 98–111)
Creatinine, Ser: 0.92 mg/dL (ref 0.44–1.00)
GFR calc Af Amer: >60 mL/min (ref >60)
GFR calc non Af Amer: >60 mL/min (ref >60)
Glucose, Bld: 105 mg/dL — ABNORMAL HIGH (ref 70–99)
Potassium: 3.5 mmol/L (ref 3.5–5.1)
Sodium: 139 mmol/L (ref 135–145)

## 2019-06-12 LAB — I-STAT BETA HCG BLOOD, ED (MC, WL, AP ONLY): I-stat hCG, quantitative: <5 m[IU]/mL (ref <5)

## 2019-06-12 NOTE — ED Triage Notes (Signed)
Pt endorses vaginal bleeding x 2 weeks with cramping. VSS.

## 2019-06-13 ENCOUNTER — Encounter (HOSPITAL_COMMUNITY): Payer: Self-pay | Admitting: Emergency Medicine

## 2019-06-13 ENCOUNTER — Emergency Department (HOSPITAL_COMMUNITY): Payer: Medicaid Other

## 2019-06-13 LAB — WET PREP, GENITAL
Sperm: NONE SEEN
Trich, Wet Prep: NONE SEEN
Yeast Wet Prep HPF POC: NONE SEEN

## 2019-06-13 MED ORDER — METRONIDAZOLE 500 MG PO TABS: 500.0000 mg | ORAL_TABLET | Freq: Two times a day (BID) | ORAL | 0 refills | Status: DC

## 2019-06-13 MED ORDER — METRONIDAZOLE 500 MG PO TABS
500.0000 mg | ORAL_TABLET | Freq: Once | ORAL | Status: AC
  Administered 2019-06-13: 500 mg via ORAL
  Filled 2019-06-13: qty 1

## 2019-06-13 NOTE — ED Provider Notes (Addendum)
[facility]   CSN: [id number] Arrival date & time: 06/12/19  1801     History   Chief Complaint Chief Complaint  Patient presents with  . Vaginal Bleeding    HPI Stephanie BelliniJasmine S Sosa is a 28 y.o. female.     The history is provided by the patient.  Vaginal Bleeding Quality:  Dark red Severity:  Moderate Onset quality:  Gradual Duration:  8 weeks Timing:  Constant Progression:  Improving Chronicity:  New Menstrual history:  Regular Number of pads used:  2 Possible pregnancy: no   Context: spontaneously   Relieved by:  Nothing Worsened by:  Nothing Ineffective treatments:  None tried Associated symptoms: no abdominal pain, no back pain, no dizziness, no dysuria, no fever and no vaginal discharge   Risk factors: no bleeding disorder and no hx of ectopic pregnancy   Elective AB several months ago now ongoing bleeding and no one can tell her why.  Denies new partners.  No discharge.  No f/c/r.  No trauma.    Past Medical History:  Diagnosis Date  . Eczema     Patient Active Problem List   Diagnosis Date Noted  . Miscarriage 11/24/2018    Past Surgical History:  Procedure Laterality Date  . NO PAST SURGERIES       OB History    Gravida  1   Para      Term      Preterm      AB      Living        SAB      TAB      Ectopic      Multiple      Live Births               Home Medications    Prior to Admission medications   Medication Sig Start Date End Date Taking? Authorizing Provider  desogestrel-ethinyl estradiol (APRI) 0.15-30 MG-MCG tablet Take 1 tablet by mouth daily. 12/09/18   Hermina StaggersErvin, Michael L, MD  ibuprofen (ADVIL,MOTRIN) 800 MG tablet Take 1 tablet (800 mg total) by mouth 3 (three) times daily. 11/24/18   Marylene LandKooistra, Kathryn Lorraine, CNM    Family History History reviewed. No pertinent family history.  Social History Social History   Tobacco Use  . Smoking status: Current Some  Day Smoker    Types: Cigarettes  . Smokeless tobacco: Never Used  Substance Use Topics  . Alcohol use: Yes  . Drug use: Yes    Types: Marijuana     Allergies   Banana   Review of Systems Review of Systems  Constitutional: Negative for fever.  HENT: Negative for sore throat.   Eyes: Negative for visual disturbance.  Respiratory: Negative for cough and shortness of breath.   Cardiovascular: Negative for chest pain.  Gastrointestinal: Negative for abdominal pain.  Genitourinary: Positive for vaginal bleeding. Negative for dysuria and vaginal discharge.  Musculoskeletal: Negative for back pain.  Neurological: Negative for dizziness.  Psychiatric/Behavioral: Negative for agitation.  All other systems reviewed and are negative.    Physical Exam Updated Vital Signs BP (!) 143/104 (BP Location: Right Arm)   Pulse 78   Temp 98.6 F (37 C)   Resp 18   LMP 06/12/2019 Comment: pt reports that she had positive home pregnancy test on 1/5  SpO2 100%   Breastfeeding No   Physical Exam Vitals signs and nursing note reviewed.  Constitutional:      General:  She is not in acute distress.    Appearance: She is normal weight.  HENT:     Head: Normocephalic and atraumatic.     Nose: Nose normal.  Eyes:     Conjunctiva/sclera: Conjunctivae normal.     Pupils: Pupils are equal, round, and reactive to light.  Neck:     Musculoskeletal: Normal range of motion and neck supple.  Cardiovascular:     Rate and Rhythm: Normal rate and regular rhythm.     Pulses: Normal pulses.     Heart sounds: Normal heart sounds.  Pulmonary:     Effort: Pulmonary effort is normal.     Breath sounds: Normal breath sounds.  Abdominal:     General: Abdomen is flat. Bowel sounds are normal.     Tenderness: There is no abdominal tenderness.  Genitourinary:    Comments: Chaperone present scant bleeding per os no cmt or adnexal tenderness Musculoskeletal: Normal range of motion.  Skin:    General: Skin  is warm and dry.     Capillary Refill: Capillary refill takes less than 2 seconds.  Neurological:     General: No focal deficit present.     Mental Status: She is alert and oriented to person, place, and time.  Psychiatric:        Mood and Affect: Mood normal.        Behavior: Behavior normal.      ED Treatments / Results  Labs (all labs ordered are listed, but only abnormal results are displayed) Labs Reviewed  WET PREP, GENITAL - Abnormal; Notable for the following components:      Result Value   Clue Cells Wet Prep HPF POC PRESENT (*)    WBC, Wet Prep HPF POC MANY (*)    All other components within normal limits  BASIC METABOLIC PANEL - Abnormal; Notable for the following components:   Glucose, Bld 105 (*)    BUN 5 (*)    All other components within normal limits  CBC WITH DIFFERENTIAL/PLATELET  I-STAT BETA HCG BLOOD, ED ([facility], [facility], [facility] ONLY)  GC/CHLAMYDIA PROBE AMP ([place]) NOT AT [facility]    EKG None  Radiology No results found.  Procedures Procedures (including critical care time)  Medications Ordered in ED Medications  metroNIDAZOLE (FLAGYL) tablet 500 mg (500 mg Oral Given 06/13/19 0043)     Irregular bleeding, scant at this time.  Will treat for BV. No signs of trauma.  No retained products will need to follow up with GYN for further testing.  Normal hemoglobin.    142 am 8/4: case d/w Dr. Roselie Awkward with negative pregnancy test [place] finding is not retained products.  No specific therapy needed.    Will treat for BV  Final Clinical Impressions(s) / ED Diagnoses   Final diagnoses:  Vaginal bleeding    Return for intractable cough, coughing up blood,fevers >100.4 unrelieved by medication, shortness of breath, intractable vomiting, chest pain, shortness of breath, weakness,numbness, changes in speech, facial asymmetry,abdominal pain, passing out,Inability to tolerate liquids or food, cough, altered mental status or any concerns. No signs of systemic illness  or infection. The patient is nontoxic-appearing on exam and vital signs are within normal limits.   I have reviewed the triage vital signs and the nursing notes. Pertinent labs &imaging results that were available during my care of the patient were reviewed by me and considered in my medical decision making (see chart for details).  After history, exam, and medical workup I feel the patient  has been appropriately medically screened and is safe for discharge home. Pertinent diagnoses were discussed with the patient. Patient was given return precautions    Jamil Armwood, MD 06/13/19 [id number]    Nicanor AlconPalumbo, Zariah Jost, MD 06/13/19 [id number]

## 2019-06-14 LAB — GC/CHLAMYDIA PROBE AMP (~~LOC~~) NOT AT ARMC
Chlamydia: NEGATIVE
Neisseria Gonorrhea: NEGATIVE

## 2019-08-26 ENCOUNTER — Encounter (HOSPITAL_COMMUNITY): Payer: Self-pay | Admitting: Emergency Medicine

## 2019-08-26 ENCOUNTER — Emergency Department (HOSPITAL_COMMUNITY)
Admission: EM | Admit: 2019-08-26 | Discharge: 2019-08-26 | Disposition: A | Discharge status: Home / Self Care | Payer: Medicaid Other | Attending: Emergency Medicine | Admitting: Emergency Medicine

## 2019-08-26 ENCOUNTER — Emergency Department (HOSPITAL_COMMUNITY): Payer: Medicaid Other

## 2019-08-26 ENCOUNTER — Other Ambulatory Visit: Payer: Self-pay

## 2019-08-26 DIAGNOSIS — Z791 Long term (current) use of non-steroidal anti-inflammatories (NSAID): Secondary | ICD-10-CM | POA: Insufficient documentation

## 2019-08-26 DIAGNOSIS — F17210 Nicotine dependence, cigarettes, uncomplicated: Secondary | ICD-10-CM | POA: Diagnosis not present

## 2019-08-26 DIAGNOSIS — N1339 Other hydronephrosis: Secondary | ICD-10-CM | POA: Insufficient documentation

## 2019-08-26 DIAGNOSIS — N23 Unspecified renal colic: Secondary | ICD-10-CM | POA: Diagnosis not present

## 2019-08-26 DIAGNOSIS — R350 Frequency of micturition: Secondary | ICD-10-CM | POA: Diagnosis present

## 2019-08-26 LAB — URINALYSIS, ROUTINE W REFLEX MICROSCOPIC
Bilirubin Urine: NEGATIVE
Glucose, UA: NEGATIVE mg/dL
Ketones, ur: 20 mg/dL — AB
Nitrite: NEGATIVE
Protein, ur: 100 mg/dL — AB
Specific Gravity, Urine: 1.006 (ref 1.005–1.030)
WBC, UA: >50 WBC/hpf — ABNORMAL HIGH (ref 0–5)
pH: 6 (ref 5.0–8.0)

## 2019-08-26 LAB — COMPREHENSIVE METABOLIC PANEL
ALT: 16 U/L (ref 0–44)
AST: 17 U/L (ref 15–41)
Albumin: 4.5 g/dL (ref 3.5–5.0)
Alkaline Phosphatase: 50 U/L (ref 38–126)
Anion gap: 10 (ref 5–15)
BUN: 8 mg/dL (ref 6–20)
CO2: 22 mmol/L (ref 22–32)
Calcium: 9.6 mg/dL (ref 8.9–10.3)
Chloride: 106 mmol/L (ref 98–111)
Creatinine, Ser: 0.85 mg/dL (ref 0.44–1.00)
GFR calc Af Amer: >60 mL/min (ref >60)
GFR calc non Af Amer: >60 mL/min (ref >60)
Glucose, Bld: 105 mg/dL — ABNORMAL HIGH (ref 70–99)
Potassium: 3.5 mmol/L (ref 3.5–5.1)
Sodium: 138 mmol/L (ref 135–145)
Total Bilirubin: 0.9 mg/dL (ref 0.3–1.2)
Total Protein: 9 g/dL — ABNORMAL HIGH (ref 6.5–8.1)

## 2019-08-26 LAB — CBC
HCT: 40.3 % (ref 36.0–46.0)
Hemoglobin: 12.8 g/dL (ref 12.0–15.0)
MCH: 29.6 pg (ref 26.0–34.0)
MCHC: 31.8 g/dL (ref 30.0–36.0)
MCV: 93.3 fL (ref 80.0–100.0)
Platelets: 140 10*3/uL — ABNORMAL LOW (ref 150–400)
RBC: 4.32 MIL/uL (ref 3.87–5.11)
RDW: 13.5 % (ref 11.5–15.5)
WBC: 12.2 10*3/uL — ABNORMAL HIGH (ref 4.0–10.5)
nRBC: 0 % (ref 0.0–0.2)

## 2019-08-26 LAB — I-STAT BETA HCG BLOOD, ED (MC, WL, AP ONLY): I-stat hCG, quantitative: <5 m[IU]/mL (ref <5)

## 2019-08-26 LAB — PREGNANCY, URINE: Preg Test, Ur: NEGATIVE

## 2019-08-26 LAB — LIPASE, BLOOD: Lipase: 22 U/L (ref 11–51)

## 2019-08-26 MED ORDER — CEPHALEXIN 500 MG PO CAPS: ORAL_CAPSULE | ORAL | 0 refills | Status: DC

## 2019-08-26 MED ORDER — ONDANSETRON HCL 4 MG/2ML IJ SOLN
4.0000 mg | Freq: Once | INTRAMUSCULAR | Status: AC
  Administered 2019-08-26: 17:00:00 4 mg via INTRAVENOUS
  Filled 2019-08-26: qty 2

## 2019-08-26 MED ORDER — ONDANSETRON HCL 4 MG PO TABS: 4.0000 mg | ORAL_TABLET | Freq: Three times a day (TID) | ORAL | 0 refills | Status: DC | PRN

## 2019-08-26 MED ORDER — SODIUM CHLORIDE 0.9 % IV SOLN
1.0000 g | Freq: Once | INTRAVENOUS | Status: AC
  Administered 2019-08-26: 1 g via INTRAVENOUS
  Filled 2019-08-26: qty 10

## 2019-08-26 MED ORDER — HYDROCODONE-ACETAMINOPHEN 5-325 MG PO TABS: 1.0000 | ORAL_TABLET | Freq: Four times a day (QID) | ORAL | 0 refills | Status: DC | PRN

## 2019-08-26 MED ORDER — HYDROMORPHONE HCL 1 MG/ML IJ SOLN
0.5000 mg | Freq: Once | INTRAMUSCULAR | Status: AC
  Administered 2019-08-26: 0.5 mg via INTRAVENOUS
  Filled 2019-08-26: qty 1

## 2019-08-26 NOTE — ED Triage Notes (Signed)
Patient here from home for possible UTI. Painful urination, frequency, and right flank pain x3 days.

## 2019-08-26 NOTE — ED Provider Notes (Signed)
[facility]-EMERGENCY DEPT Provider Note   CSN: [id number] Arrival date & time: 08/26/19  1525     History   Chief Complaint Chief Complaint  Patient presents with   Urinary Frequency   Dysuria    HPI Stephanie Sosa is a 28 y.o. female.     The history is provided by the patient and medical records.  Flank Pain This is a new problem. The current episode started 12 to 24 hours ago. The problem occurs constantly. The problem has been rapidly worsening. Associated symptoms include abdominal pain. Pertinent negatives include no chest pain, no headaches and no shortness of breath. Nothing aggravates the symptoms. Nothing relieves the symptoms.    . Past Medical History:  Diagnosis Date   Eczema     Patient Active Problem List   Diagnosis Date Noted   Miscarriage 11/24/2018    Past Surgical History:  Procedure Laterality Date   NO PAST SURGERIES       OB History    Gravida  1   Para      Term      Preterm      AB      Living        SAB      TAB      Ectopic      Multiple      Live Births               Home Medications    Prior to Admission medications   Medication Sig Start Date End Date Taking? Authorizing Provider  cephALEXin (KEFLEX) 500 MG capsule 2 caps po bid x 10 days 08/26/19   Arthor Captain, PA-C  desogestrel-ethinyl estradiol (APRI) 0.15-30 MG-MCG tablet Take 1 tablet by mouth daily. 12/09/18   Hermina Staggers, MD  HYDROcodone-acetaminophen (NORCO) 5-325 MG tablet Take 1 tablet by mouth every 6 (six) hours as needed for severe pain. 08/26/19   Lisandra Mathisen, Cammy Copa, PA-C  ibuprofen (ADVIL,MOTRIN) 800 MG tablet Take 1 tablet (800 mg total) by mouth 3 (three) times daily. 11/24/18   Marylene Land, CNM  metroNIDAZOLE (FLAGYL) 500 MG tablet Take 1 tablet (500 mg total) by mouth 2 (two) times daily. One po bid x 7 days 06/13/19   Palumbo, April, MD  ondansetron (ZOFRAN) 4 MG tablet Take 1 tablet (4 mg  total) by mouth every 8 (eight) hours as needed for nausea or vomiting. 08/26/19   Arthor Captain, PA-C    Family History No family history on file.  Social History Social History   Tobacco Use   Smoking status: Current Some Day Smoker    Types: Cigarettes   Smokeless tobacco: Never Used  Substance Use Topics   Alcohol use: Yes   Drug use: Yes    Types: Marijuana     Allergies   Banana   Review of Systems Review of Systems  Constitutional: Negative for chills and fever.  Respiratory: Negative for shortness of breath.   Cardiovascular: Negative for chest pain.  Gastrointestinal: Positive for abdominal pain and vomiting.  Genitourinary: Positive for flank pain, frequency and urgency. Negative for dyspareunia, vaginal bleeding, vaginal discharge and vaginal pain.  Neurological: Negative for headaches.  All other systems reviewed and are negative.    Physical Exam Updated Vital Signs BP 124/83 (BP Location: Right Arm)    Pulse 85    Temp 98.9 F (37.2 C) (Oral)    Resp 18    LMP 11/04/2018 (Approximate) Comment: pt reports that  she had positive home pregnancy test on 1/5   SpO2 99%   Physical Exam Vitals signs and nursing note reviewed.  Constitutional:      General: She is not in acute distress.    Appearance: She is well-developed. She is not diaphoretic.     Comments: Patient tearful, unable to sit still, pacing the room, retching, holding her right flank and right lower pelvis region.  She exits multiple times to try to urinate in the bathroom which is adjacent  HENT:     Head: Normocephalic and atraumatic.  Eyes:     General: No scleral icterus.    Conjunctiva/sclera: Conjunctivae normal.  Neck:     Musculoskeletal: Normal range of motion.  Cardiovascular:     Rate and Rhythm: Normal rate and regular rhythm.     Heart sounds: Normal heart sounds. No murmur. No friction rub. No gallop.   Pulmonary:     Effort: Pulmonary effort is normal. No respiratory  distress.     Breath sounds: Normal breath sounds.  Abdominal:     General: Bowel sounds are normal. There is no distension.     Palpations: Abdomen is soft. There is no mass.     Tenderness: There is no abdominal tenderness. There is right CVA tenderness. There is no guarding.  Skin:    General: Skin is warm and dry.  Neurological:     Mental Status: She is alert and oriented to person, place, and time.  Psychiatric:        Behavior: Behavior normal.      ED Treatments / Results  Labs (all labs ordered are listed, but only abnormal results are displayed) Labs Reviewed  URINALYSIS, ROUTINE W REFLEX MICROSCOPIC - Abnormal; Notable for the following components:      Result Value   APPearance CLOUDY (*)    Hgb urine dipstick MODERATE (*)    Ketones, ur 20 (*)    Protein, ur 100 (*)    Leukocytes,Ua LARGE (*)    WBC, UA >50 (*)    Bacteria, UA RARE (*)    All other components within normal limits  CBC - Abnormal; Notable for the following components:   WBC 12.2 (*)    Platelets 140 (*)    All other components within normal limits  COMPREHENSIVE METABOLIC PANEL - Abnormal; Notable for the following components:   Glucose, Bld 105 (*)    Total Protein 9.0 (*)    All other components within normal limits  URINE CULTURE  PREGNANCY, URINE  LIPASE, BLOOD  I-STAT BETA HCG BLOOD, ED ([facility], [facility], [facility] ONLY)    EKG None  Radiology Ct Renal Stone Study  Result Date: 08/26/2019 CLINICAL DATA:  Right flank pain for 3 days.  Urinary frequency. EXAM: CT ABDOMEN AND PELVIS WITHOUT CONTRAST TECHNIQUE: Multidetector CT imaging of the abdomen and pelvis was performed following the standard protocol without IV contrast. COMPARISON:  None. FINDINGS: Lower chest: No acute abnormality. Hepatobiliary: No focal liver abnormality is seen. No gallstones, gallbladder wall thickening, or biliary dilatation. Pancreas: Unremarkable. No pancreatic ductal dilatation or surrounding inflammatory changes.  Spleen: Normal in size without focal abnormality. Adrenals/Urinary Tract: No renal stones are identified. No hydronephrosis on the left. There is a mildly prominent extrarenal pelvis on the right. Possible mild hydronephrosis on the right. No perinephric stranding associated with either kidney. No ureteral stones are noted. The right ureter is mildly prominent compared to the left. The bladder is poorly distended but unremarkable. Adrenal glands are  normal. Stomach/Bowel: The stomach and small bowel are normal. The colon and appendix are normal. Vascular/Lymphatic: No significant vascular findings are present. No enlarged abdominal or pelvic lymph nodes. Reproductive: Uterus and bilateral adnexa are unremarkable. Other: Small amount of fluid is seen in the pelvis, possibly physiologic. There is a fat containing umbilical hernia, small. Musculoskeletal: No acute or significant osseous findings. IMPRESSION: 1. There appears to be mild right hydronephrosis, a mildly prominent right renal pelvis, and a mildly prominent right ureter compared to the left. No stones are seen. The findings could represent a recently passed stone in the appropriate clinical setting. 2. The appendix is visualized and normal. 3. No other cause for the patient's symptoms identified. A small amount of fluid in the pelvis is probably physiologic. 4. There is a small fat containing umbilical hernia. Electronically Signed   By: Gerome Sam III M.D   On: 08/26/2019 17:47    Procedures Procedures (including critical care time)  Medications Ordered in ED Medications  HYDROmorphone (DILAUDID) injection 0.5 mg (0.5 mg Intravenous Given 08/26/19 1637)  ondansetron (ZOFRAN) injection 4 mg (4 mg Intravenous Given 08/26/19 1637)  cefTRIAXone (ROCEPHIN) 1 g in sodium chloride 0.9 % 100 mL IVPB (1 g Intravenous New Bag/Given 08/26/19 1904)     Initial Impression / Assessment and Plan / ED Course  I have reviewed the triage vital signs and  the nursing notes.  Pertinent labs & imaging results that were available during my care of the patient were reviewed by me and considered in my medical decision making (see chart for details).        Given the large differential diagnosis for Stephanie Sosa, the decision making in this case is of high complexity. I have reviewed the patient's labs which show elevated white blood cell count, mildly low thrombocyte count of uncertain etiology.  Negative pregnancy test, CMP/slightly elevated blood glucose level likely secondary to acute phase reaction.  Urine is positive for hemoglobin, ketones and protein, large leukocytes, rare bacteria and 6-10 squamous epithelial cells. I personally reviewed the patient's images which show dilated right ureter renal pelvis suggestive of recently passed stone. After evaluating all of the data points in this case, the presentation of Stephanie Sosa is  most likely recently passed kidney stone however given her symptoms will also treat for urinary tract infection with single dose of IV Rocephin and extended course of Keflex..  The presentation NOT consistent with an infected stone, nephric abscess, sepsis, or renal failure.  Similarly, this presentation is NOT consistent with AAA; Mesenteric Ischemia; Bowel Perforation; Bowel Obstruction; Sigmoid Volvulus; Diverticulitis; Appendicitis; Peritonitis; Cholecystitis, ascending cholangitis or other gallbladder disease; perforated ulcer; significant GI bleeding, splenic rupture/infarction; Hepatic abscess; or other surgical/acute abdomen.  Similarly, this presentation is NOT consistent with ACS or Myocardial Ischemia; Pulmonary Embolism; fistula; incarcerated hernia; Pancreatitis, Aortic Dissection; Diabetic Ketoacidosis; Ischemic colitis; Psoas or other abscess; Methanol poisoning; Heavy metal toxicity; or porphyria.  Similarly, this case is NOT consistent with Fitz-Hugh-Curtis Syndrome, Ectopic Pregnancy, Placental  Abruption, PID, Tubo-ovarian abscess, Ovarian Torsion, or STI.  Similarly, this presentation is NOT consistent with acute coronary syndrome, pulmonary embolism, dissection, borhaave's, arrythmia, pneumothorax, cardiac tamponade, or other emergent cardiopulmonary condition.  Similarly, this presentation is NOT consistent with pyelonephritis, urinary infection, pneumonia, or other focal bacterial infection.  Strict return and follow-up precautions have been given by me personally or by detailed written instruction verbalized by nursing staff using the teach back method. to the patient/family/caregiver(s).  Data Reviewed/Counseling: I  have reviwed the patient's vital signs, nursing notes, and other relevant tests/information. I had a detailed discussion regarding the historical points, exam findings, and any diagnostic results supporting the discharge diagnosis. I also discussed the need for outpatient follow-up and the need to return to the ED if symptoms worsen or if there are any questions or concerns that arise at home.   Final Clinical Impressions(s) / ED Diagnoses   Final diagnoses:  Renal colic on right side  Other hydronephrosis    ED Discharge Orders         Ordered    HYDROcodone-acetaminophen (NORCO) 5-325 MG tablet  Every 6 hours PRN     08/26/19 1947    ondansetron (ZOFRAN) 4 MG tablet  Every 8 hours PRN     08/26/19 1947    cephALEXin (KEFLEX) 500 MG capsule  Status:  Discontinued     08/26/19 1949    cephALEXin (KEFLEX) 500 MG capsule     08/26/19 1949           Margarita Mail, PA-C 08/26/19 1951    Julianne Rice, MD 08/26/19 2318

## 2019-08-26 NOTE — Discharge Instructions (Signed)
Return to the ED immediately if you develop fever, uncontrolled pain or vomiting, or other concerns. ° °

## 2019-08-29 LAB — URINE CULTURE: Culture: >=100000 — AB

## 2019-08-30 ENCOUNTER — Telehealth: Payer: Self-pay | Admitting: *Deleted

## 2019-08-30 NOTE — Telephone Encounter (Signed)
Post ED Visit - Positive Culture Follow-up  Culture report reviewed by antimicrobial stewardship pharmacist: [facility] []  Nathan Batchelder, Pharm.D. []  [place], Pharm.D., BCPS AQ-ID []  Heide Guile, Pharm.D., BCPS []  [place]ks Neptune, Pharm.D., BCPS []  [place], [place].D., BCPS, AAHIVP []  [place], Pharm.D., BCPS, AAHIVP []  Legrand Como, PharmD, BCPS []  Salome Arnt, PharmD, BCPS []  Johnnette Gourd, PharmD, BCPS []  Hughes Better, PharmD []  Leeroy Cha, PharmD, BCPS []  Laqueta Linden, PharmD  [facility] Team [x]  [address], PharmD []  Leodis Sias, PharmD []  Lindell Spar, PharmD []  Royetta Asal, Rph []  Graylin Shiver) Rema Fendt, PharmD []  Glennon Mac, PharmD []  Arlyn Dunning, PharmD []  Netta Cedars, PharmD []  Dia Sitter, PharmD []  Leone Haven, PharmD []  Gretta Arab, PharmD []  Theodis Shove, PharmD []  Peggyann Juba, PharmD   Positive urine culture Treated with Cephalexin, organism sensitive to the same and no further patient follow-up is required at this time.  Reuel Boom [facility] 08/30/2019, 12:40 PM

## 2020-03-18 IMAGING — US US OB COMP LESS 14 WK
1 series · 14 of 28 positions shown · non-contrast
Comparison: None.

CLINICAL DATA: First trimester of pregnancy, vaginal bleeding and
lower abdominal cramping.

EXAM:
OBSTETRIC <14 WK US AND TRANSVAGINAL OB US
TECHNIQUE: Both transabdominal and transvaginal ultrasound examinations were
performed for complete evaluation of the gestation as well as the
maternal uterus, adnexal regions, and pelvic cul-de-sac.
Transvaginal technique was performed to assess early pregnancy.

[Series 1: us ob comp less 14 wk · 67 acquisitions, 14 frames shown]
[im 3/67]
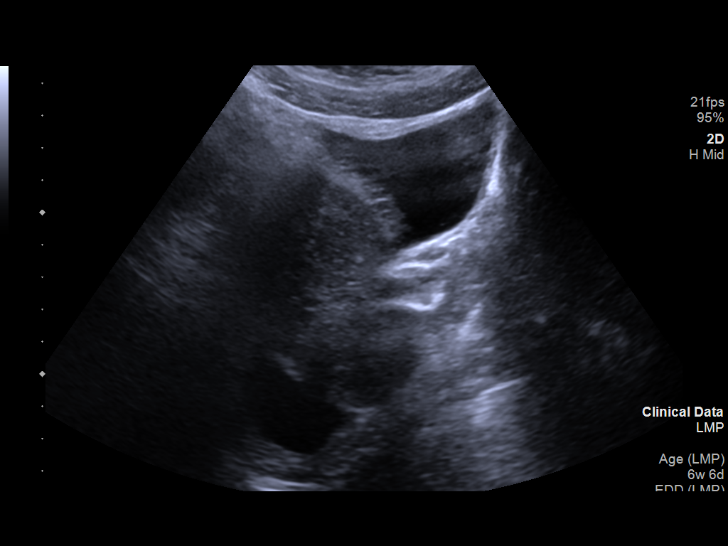
[im 8/67]
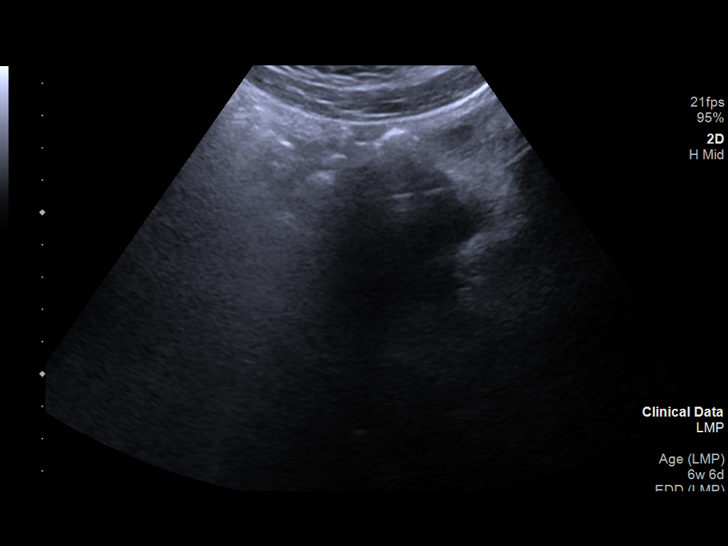
[im 13/67]
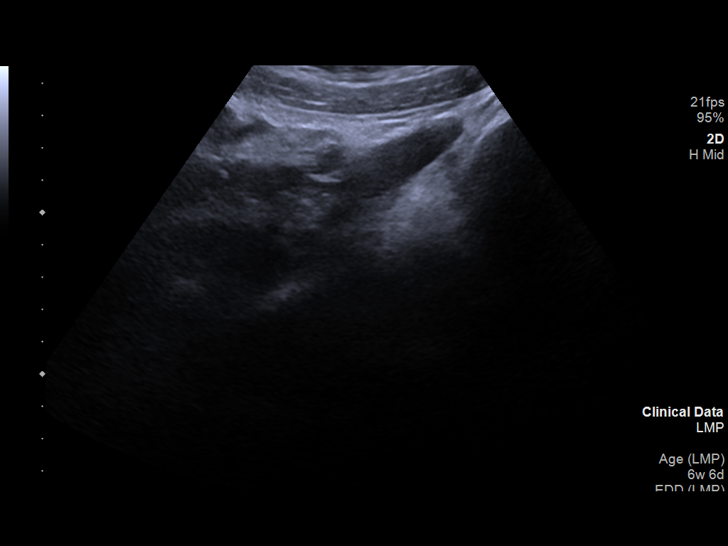
[im 18/67]
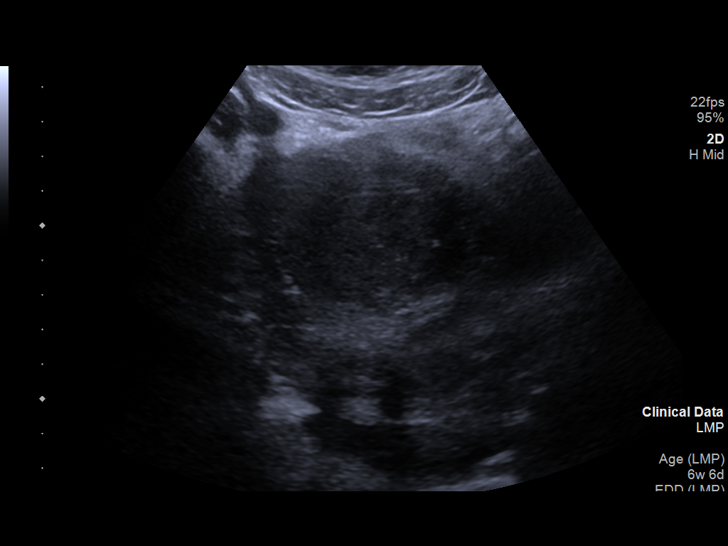
[im 23/67]
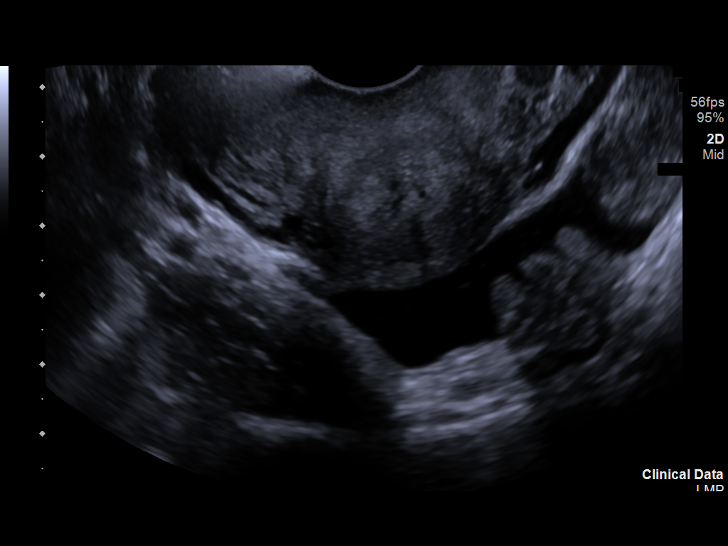
[im 27/67]
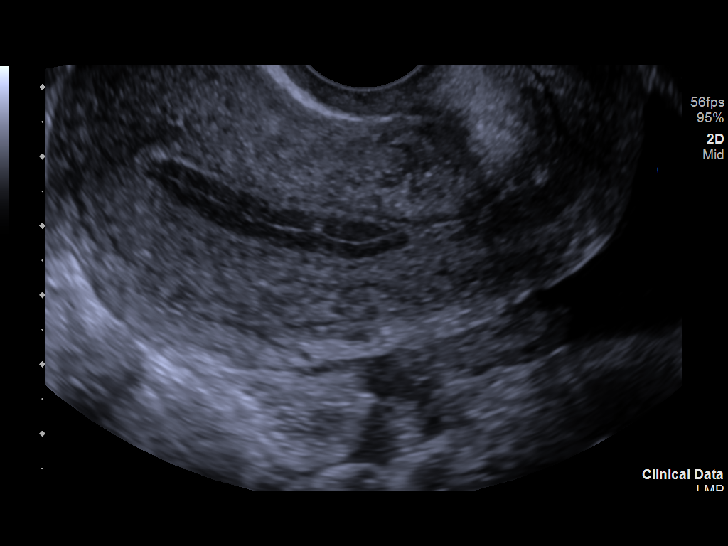
[im 32/67]
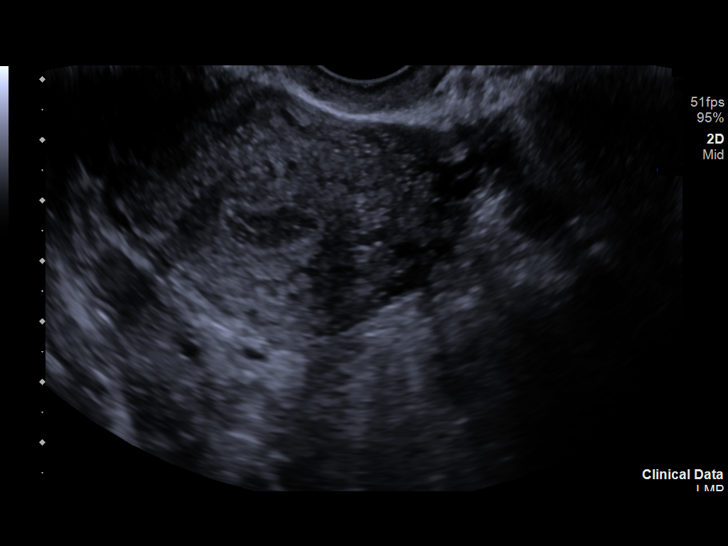
[im 37/67]
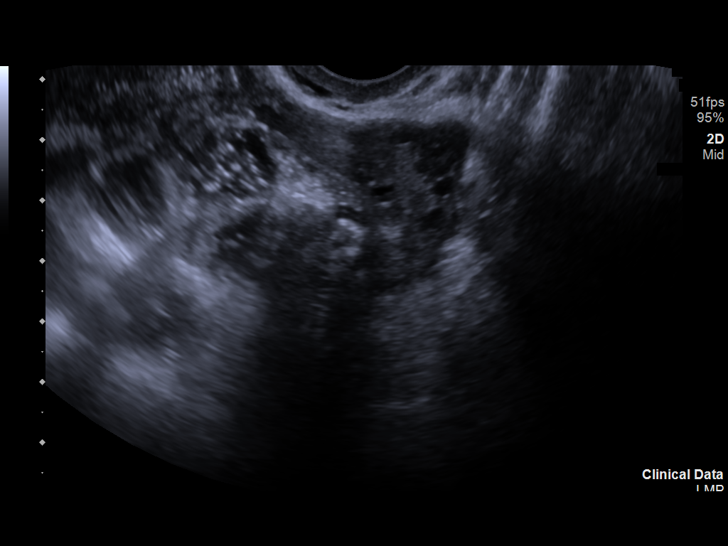
[im 42/67]
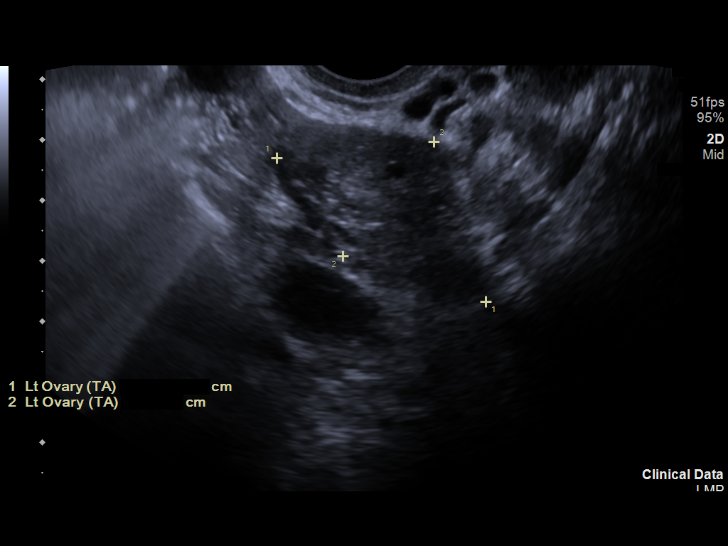
[im 47/67]
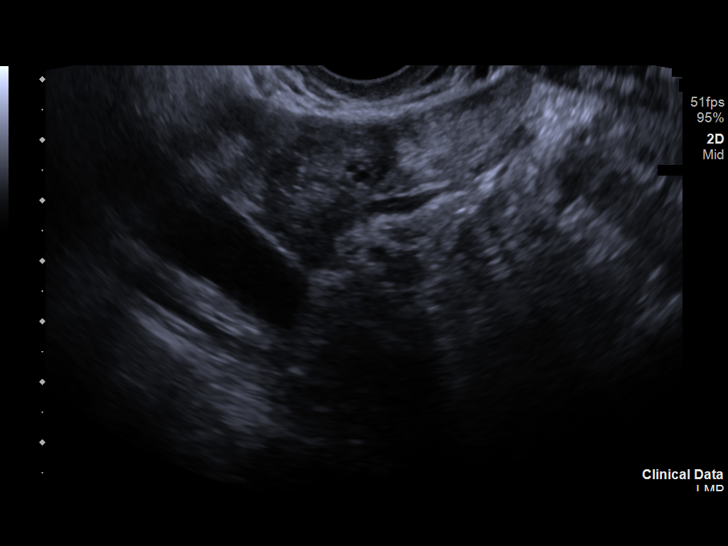
[im 52/67]
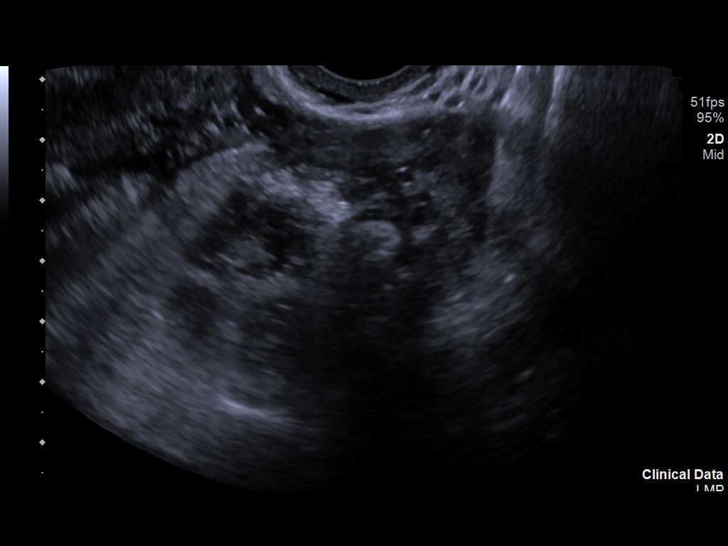
[im 57/67]
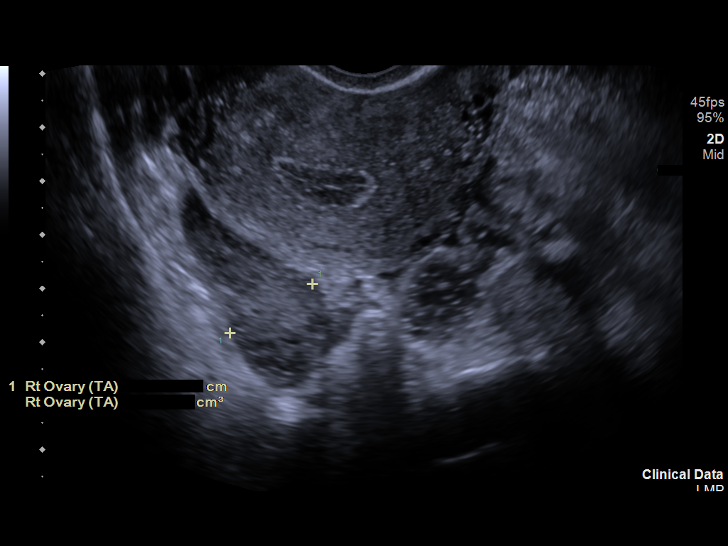
[im 62/67]
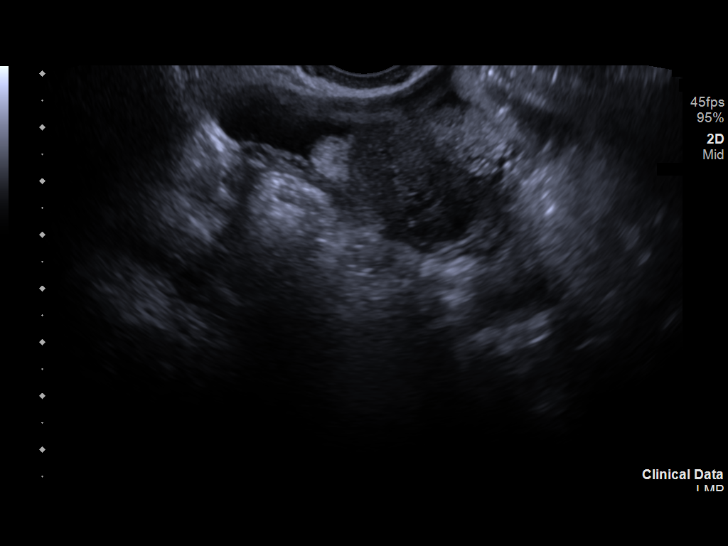
[im 67/67]
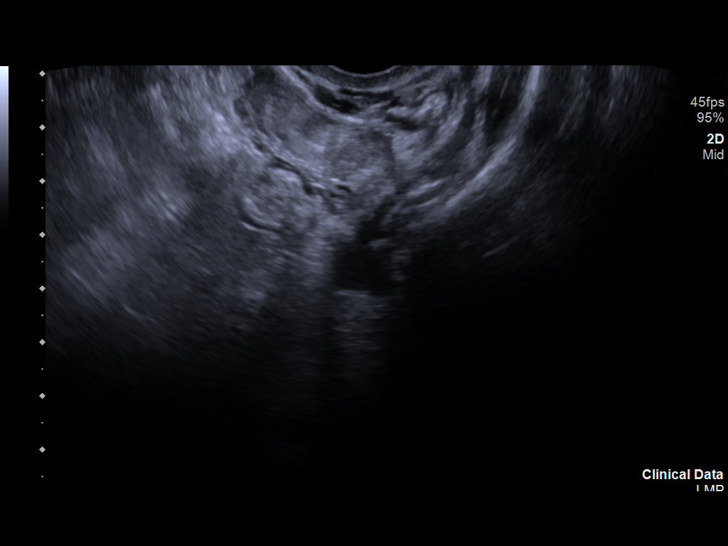

[14 of 28 positions shown; findings below may reference images not displayed]

FINDINGS: Intrauterine gestational sac: Not visualized.

Yolk sac:  Not visualized.

Embryo:  Not visualized.

Cardiac Activity: Not visualized.

Subchorionic hemorrhage:  None visualized.

Maternal uterus/adnexae: Both ovaries appear normal. Mild amount of
free fluid is noted which most likely is physiologic.
IMPRESSION: No intrauterine gestational sac, yolk sac, fetal pole, or cardiac
activity visualized. Differential considerations include
intrauterine gestation too early to be sonographically visualized,
spontaneous abortion, or ectopic pregnancy. Consider follow-up
ultrasound in 14 days and serial quantitative beta HCG follow-up.

## 2020-12-21 IMAGING — CT CT RENAL STONE PROTOCOL
2 of 4 series · 16 of 46 positions shown, 18 images · non-contrast
Comparison: None.

CLINICAL DATA: Right flank pain for 3 days.  Urinary frequency.

EXAM:
CT ABDOMEN AND PELVIS WITHOUT CONTRAST
TECHNIQUE: Multidetector CT imaging of the abdomen and pelvis was performed
following the standard protocol without IV contrast.

[Series 2: axial st · axial · 0.66mm/px · z∈[-422,-26]mm · 13 of 91 slices shown, 15 images]
[im 6/91  soft-tissue]
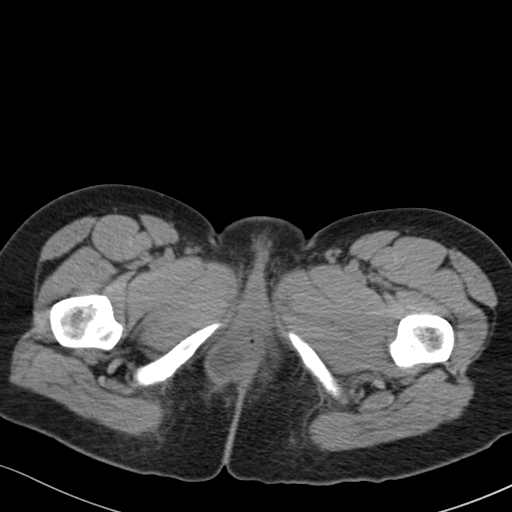
[im 6/91  bone]
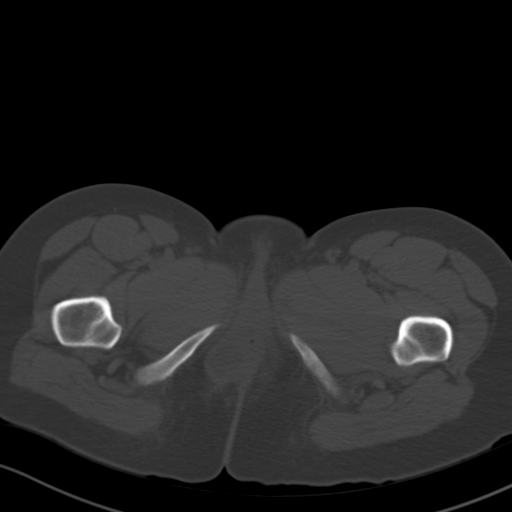
[im 12/91  soft-tissue]
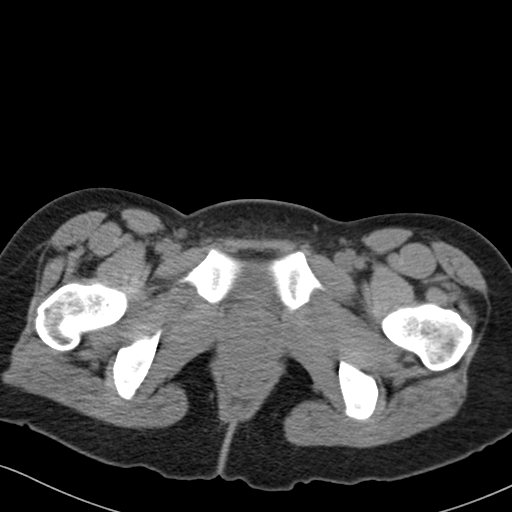
[im 17/91  soft-tissue]
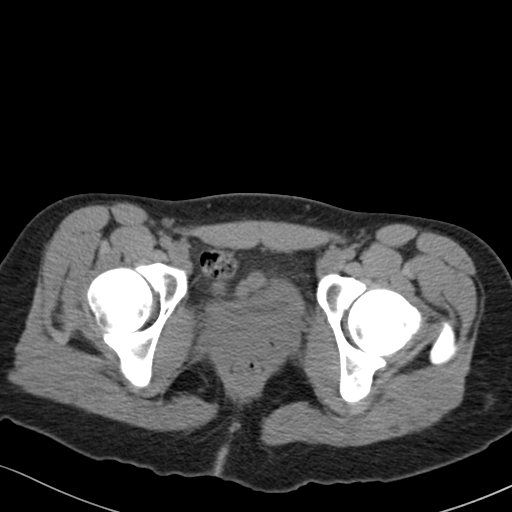
[im 29/91  soft-tissue]
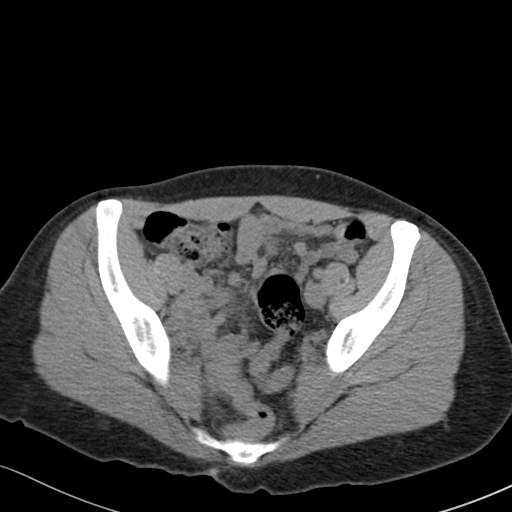
[im 34/91  soft-tissue]
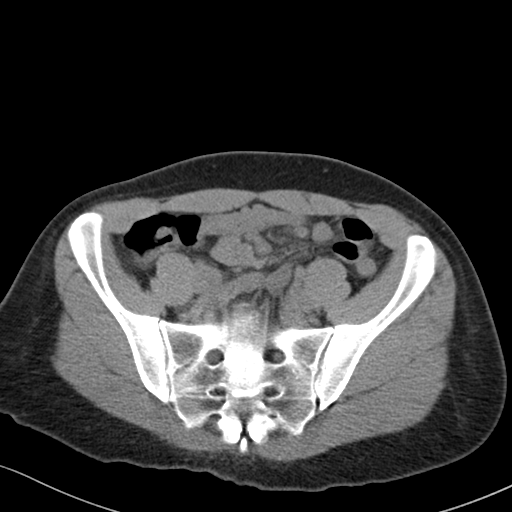
[im 40/91  soft-tissue]
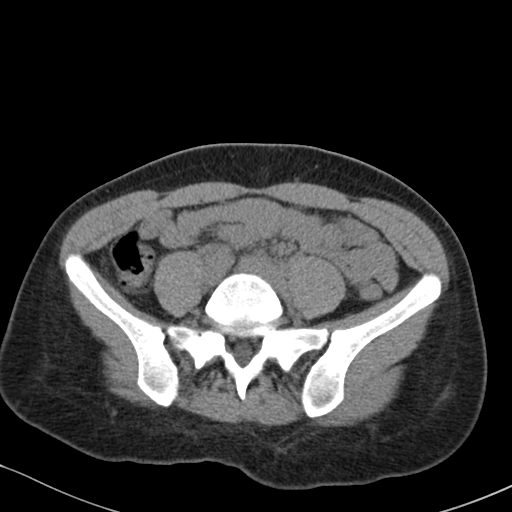
[im 46/91  soft-tissue]
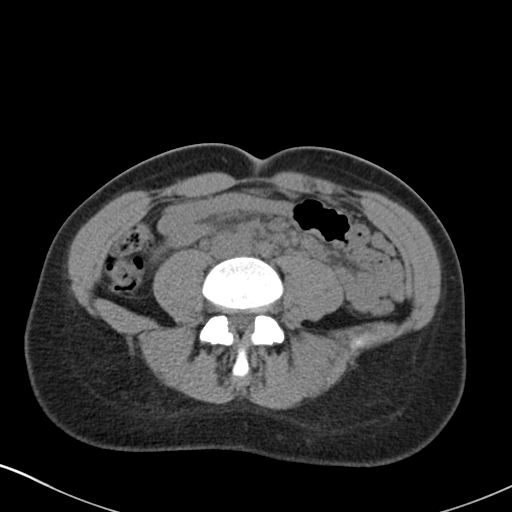
[im 51/91  soft-tissue]
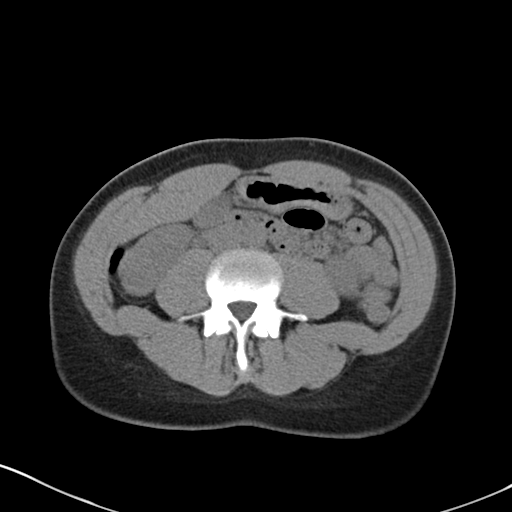
[im 57/91  soft-tissue]
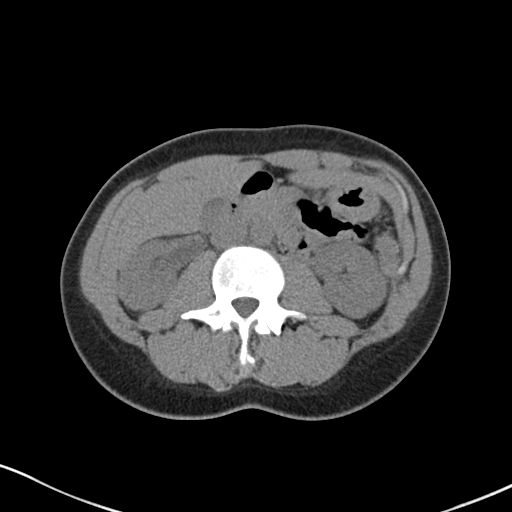
[im 57/91  bone]
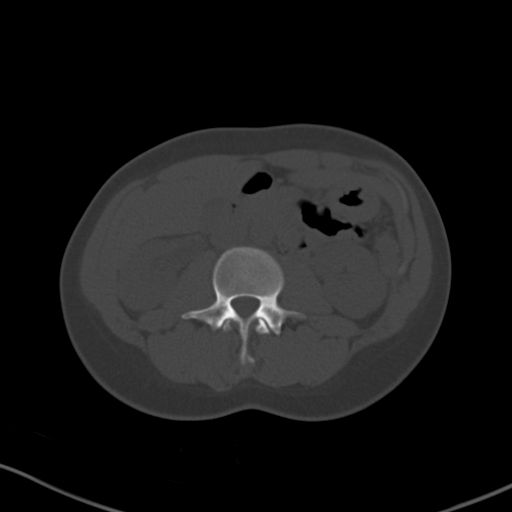
[im 62/91  soft-tissue]
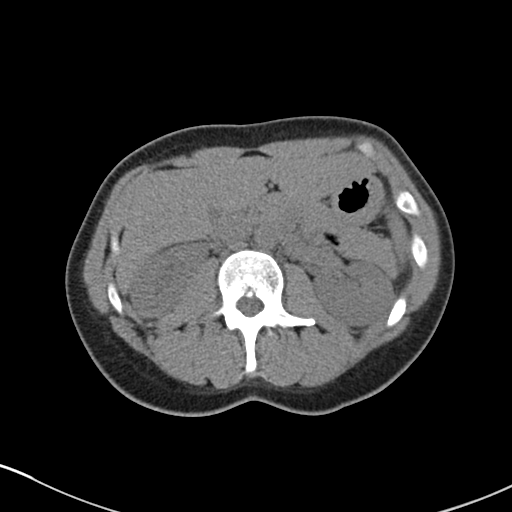
[im 74/91  soft-tissue]
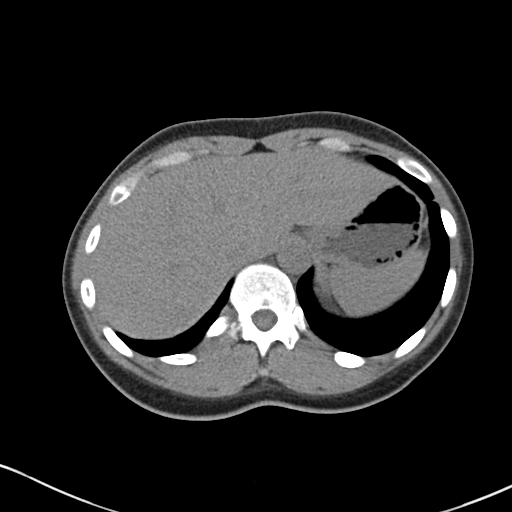
[im 79/91  soft-tissue]
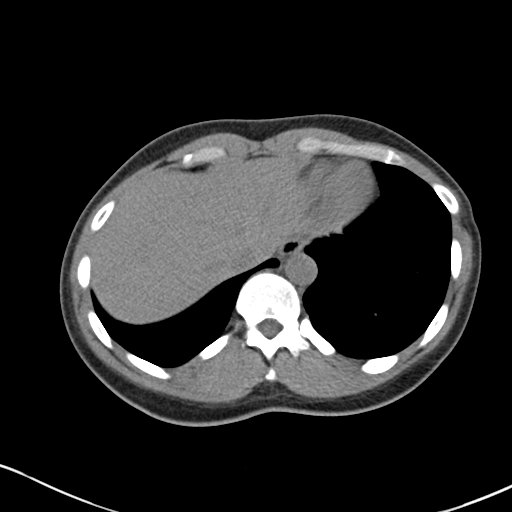
[im 85/91  soft-tissue]
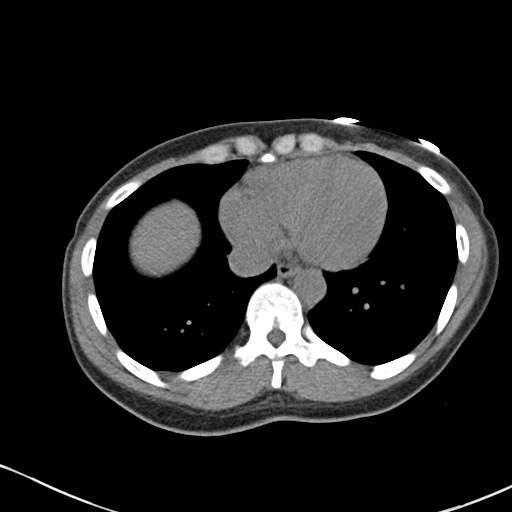

[Series 4: coronal · coronal · 0.82mm/px · 3 of 117 slices shown]
[im 39/117  soft-tissue]
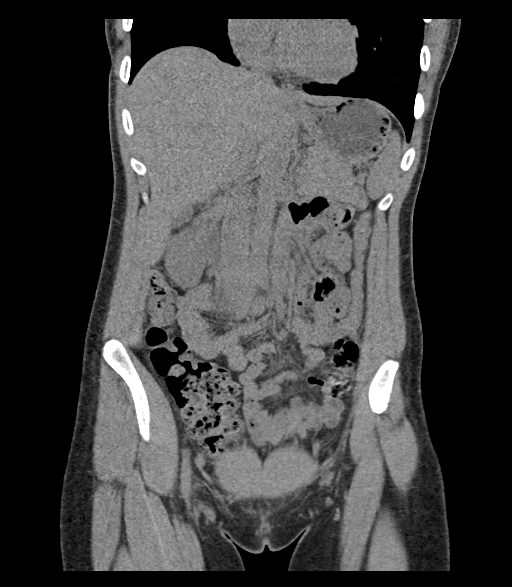
[im 52/117  soft-tissue]
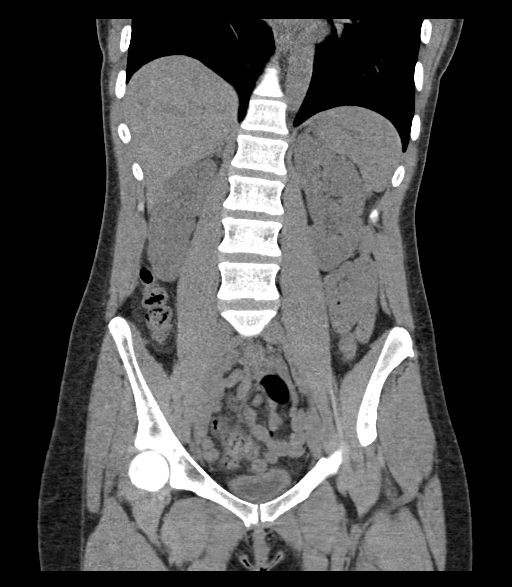
[im 65/117  soft-tissue]
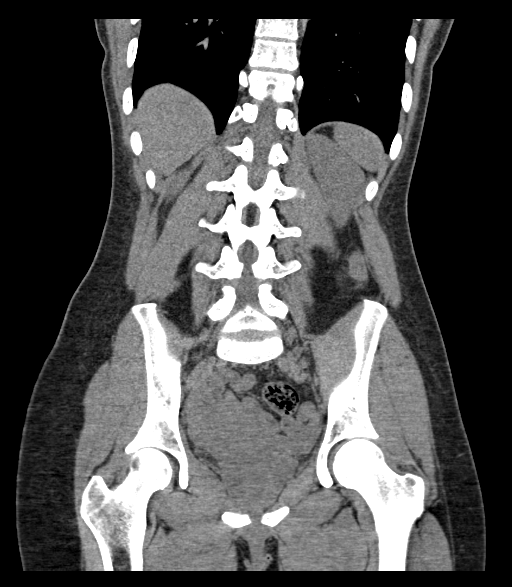

[16 of 46 positions shown; findings below may reference images not displayed]

FINDINGS: Lower chest: No acute abnormality.

Hepatobiliary: No focal liver abnormality is seen. No gallstones,
gallbladder wall thickening, or biliary dilatation.

Pancreas: Unremarkable. No pancreatic ductal dilatation or
surrounding inflammatory changes.

Spleen: Normal in size without focal abnormality.

Adrenals/Urinary Tract: No renal stones are identified. No
hydronephrosis on the left. There is a mildly prominent extrarenal
pelvis on the right. Possible mild hydronephrosis on the right. No
perinephric stranding associated with either kidney. No ureteral
stones are noted. The right ureter is mildly prominent compared to
the left. The bladder is poorly distended but unremarkable. Adrenal
glands are normal.

Stomach/Bowel: The stomach and small bowel are normal. The colon and
appendix are normal.

Vascular/Lymphatic: No significant vascular findings are present. No
enlarged abdominal or pelvic lymph nodes.

Reproductive: Uterus and bilateral adnexa are unremarkable.

Other: Small amount of fluid is seen in the pelvis, possibly
physiologic. There is a fat containing umbilical hernia, small.

Musculoskeletal: No acute or significant osseous findings.
IMPRESSION: 1. There appears to be mild right hydronephrosis, a mildly prominent
right renal pelvis, and a mildly prominent right ureter compared to
the left. No stones are seen. The findings could represent a
recently passed stone in the appropriate clinical setting.
2. The appendix is visualized and normal.
3. No other cause for the patient's symptoms identified. A small
amount of fluid in the pelvis is probably physiologic.
4. There is a small fat containing umbilical hernia.

## 2023-01-02 ENCOUNTER — Emergency Department (HOSPITAL_COMMUNITY): Payer: No Typology Code available for payment source

## 2023-01-02 ENCOUNTER — Encounter (HOSPITAL_COMMUNITY): Payer: Self-pay

## 2023-01-02 ENCOUNTER — Emergency Department (HOSPITAL_COMMUNITY)
Admission: EM | Admit: 2023-01-02 | Discharge: 2023-01-03 | Disposition: A | Discharge status: Home / Self Care | Payer: No Typology Code available for payment source | Attending: Emergency Medicine | Admitting: Emergency Medicine

## 2023-01-02 ENCOUNTER — Other Ambulatory Visit: Payer: Self-pay

## 2023-01-02 DIAGNOSIS — S62336A Displaced fracture of neck of fifth metacarpal bone, right hand, initial encounter for closed fracture: Secondary | ICD-10-CM | POA: Diagnosis not present

## 2023-01-02 DIAGNOSIS — W500XXA Accidental hit or strike by another person, initial encounter: Secondary | ICD-10-CM | POA: Diagnosis not present

## 2023-01-02 DIAGNOSIS — S6991XA Unspecified injury of right wrist, hand and finger(s), initial encounter: Secondary | ICD-10-CM | POA: Diagnosis present

## 2023-01-02 MED ORDER — FENTANYL CITRATE PF 50 MCG/ML IJ SOSY: 50.0000 ug | PREFILLED_SYRINGE | Freq: Once | INTRAMUSCULAR | Status: DC

## 2023-01-02 MED ORDER — FENTANYL CITRATE PF 50 MCG/ML IJ SOSY
50.0000 ug | PREFILLED_SYRINGE | Freq: Once | INTRAMUSCULAR | Status: AC
  Administered 2023-01-02: 50 ug via INTRAMUSCULAR
  Filled 2023-01-02: qty 1

## 2023-01-02 MED ORDER — HYDROCODONE-ACETAMINOPHEN 5-325 MG PO TABS: 2.0000 | ORAL_TABLET | ORAL | 0 refills | Status: DC | PRN
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
## 2023-01-02 NOTE — Discharge Instructions (Addendum)
You are seen in the emergency department for a fracture of the fifth metacarpal in your right hand.  We were able to reduce this fracture here in the emergency department but he should still plan to follow-up with the hand specialist for further evaluation and possible intervention if they deem it necessary.  I prescribed some pain medication he can take over the next several days to alleviate some of the pain that you will be feeling from this injury.  He should also take some over-the-counter anti-inflammatory medication such as ibuprofen or Aleve.  If he take ibuprofen, he can take ibuprofen 800 mg 3 times a day.   The hand specialist is Dr. Tempie Donning.  He should please plan to call his office to schedule an appointment to be evaluated.
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
## 2023-03-25 ENCOUNTER — Encounter: Payer: No Typology Code available for payment source | Admitting: Obstetrics and Gynecology

## 2023-05-04 ENCOUNTER — Other Ambulatory Visit (HOSPITAL_COMMUNITY)
Admission: RE | Admit: 2023-05-04 | Discharge: 2023-05-04 | Disposition: A | Discharge status: Home / Self Care | Payer: No Typology Code available for payment source | Source: Ambulatory Visit | Attending: Obstetrics and Gynecology | Admitting: Obstetrics and Gynecology

## 2023-05-04 ENCOUNTER — Encounter: Payer: Self-pay | Admitting: Certified Nurse Midwife

## 2023-05-04 ENCOUNTER — Ambulatory Visit (INDEPENDENT_AMBULATORY_CARE_PROVIDER_SITE_OTHER): Payer: No Typology Code available for payment source | Admitting: Certified Nurse Midwife

## 2023-05-04 VITALS — BP 146/100 | HR 78 | Ht 65.0 in | Wt 185.7 lb

## 2023-05-04 DIAGNOSIS — A630 Anogenital (venereal) warts: Secondary | ICD-10-CM | POA: Diagnosis not present

## 2023-05-04 DIAGNOSIS — Z124 Encounter for screening for malignant neoplasm of cervix: Secondary | ICD-10-CM | POA: Diagnosis not present

## 2023-05-04 DIAGNOSIS — Z3009 Encounter for other general counseling and advice on contraception: Secondary | ICD-10-CM

## 2023-05-04 DIAGNOSIS — N750 Cyst of Bartholin's gland: Secondary | ICD-10-CM

## 2023-05-04 DIAGNOSIS — Z01419 Encounter for gynecological examination (general) (routine) without abnormal findings: Secondary | ICD-10-CM

## 2023-05-04 DIAGNOSIS — Z113 Encounter for screening for infections with a predominantly sexual mode of transmission: Secondary | ICD-10-CM

## 2023-05-04 NOTE — Progress Notes (Signed)
New patient is in the office to establish care Pt reports regular cycles, LMP 03/23/23 Declines BC Reports having scar tissue from bartholin cyst being drained years ago and wants info abut how it could be removed.  Reports [facility] STD [facility] at [facility]

## 2023-05-06 DIAGNOSIS — A630 Anogenital (venereal) warts: Secondary | ICD-10-CM | POA: Insufficient documentation

## 2023-05-06 DIAGNOSIS — N750 Cyst of Bartholin's gland: Secondary | ICD-10-CM | POA: Insufficient documentation

## 2023-05-06 NOTE — Progress Notes (Signed)
GYNECOLOGY OFFICE VISIT NOTE  History:   Stephanie Sosa is a 32 y.o. G2P0020 here today for New GYN visit . She denies any abnormal vaginal discharge, bleeding, pelvic pain or other concerns. She reports normal menstrual periods with 21-25 day cycles and a 4 day bleed. She states that her heaviest days are days 1-2 and reports going through 3-4 pads per day. She has a history of a bartholin cyst removal and noted some scar tissue has formed in that area. She also notes several genital warts that she would like to have removed via cryotherapy.     Past Medical History:  Diagnosis Date   Eczema     Past Surgical History:  Procedure Laterality Date   NO PAST SURGERIES      The following portions of the patient's history were reviewed and updated as appropriate: allergies, current medications, past family history, past medical history, past social history, past surgical history and problem list.   Health Maintenance:  Patient is unsure of last pap and results. She states that she believes that all of her PAP smears have been normal.  Review of Systems:  Pertinent items noted in HPI and remainder of comprehensive ROS otherwise negative.  Physical Exam:  BP (!) 146/100   Pulse 78   Ht 5\' 5"  (1.651 m)   Wt 185 lb 11.2 oz (84.2 kg)   LMP 03/23/2023   BMI 30.90 kg/m  CONSTITUTIONAL: Well-developed, well-nourished female in no acute distress.  HEENT:  Normocephalic, atraumatic. External right and left ear normal. No scleral icterus.  NECK: Normal range of motion, supple, no masses noted on observation SKIN: No rash noted. Not diaphoretic. No erythema. No pallor. MUSCULOSKELETAL: Normal range of motion. No edema noted. NEUROLOGIC: Alert and oriented to person, place, and time. Normal muscle tone coordination. No cranial nerve deficit noted. PSYCHIATRIC: Normal mood and affect. Normal behavior. Normal judgment and thought content. CARDIOVASCULAR: Normal heart rate noted RESPIRATORY:  Effort and breath sounds normal, no problems with respiration noted ABDOMEN: No masses noted. No other overt distention noted.   PELVIC: Normal appearing external genitalia; normal urethral meatus; normal appearing vaginal mucosa and cervix.  No abnormal discharge noted.  Normal uterine size, no other palpable masses, no uterine or adnexal tenderness. Performed in the presence of a chaperone. A few small genital warts observed .  Labs and Imaging No results found for this or any previous visit (from the past 168 hour(s)). No results found.    Assessment and Plan:    1. Well woman exam - Patient overall doing well.  - Cytology - PAP( [place])  2. Genital warts - Reviewed that Cryotherapy is not a service that is offered at [facility] but that the patient can be placed at [facility] to have procedure  - Dr. Para March to patient bedside to evaluate the extent of cryotherapy and warts.  - Message sent to [facility] to get patient scheduled for cryo therapy with Dr. Para March    3. Pap smear for cervical cancer screening - Pap smear collected today.   4. Bartholin's cyst - Stable at this time. Looks like cyst has reoccurred. Dr. Para March reported that she will speak with patient more about the plan of care at [facility].    Routine preventative health maintenance measures emphasized. Please refer to After Visit Summary for other counseling recommendations.   Return for Call patient to set up appt. .    I spent 30 minutes dedicated to the care of this patient  including pre-visit review of records, face to face time with the patient discussing her conditions and treatments and post visit orders.    [name]) Suzie Portela, MSN, CNM  Center for [facility]  05/06/23 3:45 AM

## 2023-05-18 LAB — CYTOLOGY - PAP
Adequacy: ABSENT
Comment: NEGATIVE
Comment: NEGATIVE
Comment: NEGATIVE
HPV 16: NEGATIVE
HPV 18 / 45: NEGATIVE
High risk HPV: POSITIVE — AB

## 2023-06-01 ENCOUNTER — Telehealth: Payer: Self-pay

## 2023-06-01 NOTE — Telephone Encounter (Signed)
Attempted to contact pt regarding pap smear results and plan for tomorrow's appointment. No answer

## 2023-06-02 ENCOUNTER — Ambulatory Visit: Payer: No Typology Code available for payment source | Admitting: Obstetrics and Gynecology

## 2023-06-02 ENCOUNTER — Other Ambulatory Visit (HOSPITAL_COMMUNITY)
Admission: RE | Admit: 2023-06-02 | Discharge: 2023-06-02 | Disposition: A | Discharge status: Home / Self Care | Payer: No Typology Code available for payment source | Source: Ambulatory Visit | Attending: Obstetrics and Gynecology | Admitting: Obstetrics and Gynecology

## 2023-06-02 ENCOUNTER — Encounter: Payer: Self-pay | Admitting: Obstetrics and Gynecology

## 2023-06-02 VITALS — BP 132/88 | HR 93 | Ht 65.0 in | Wt 184.0 lb

## 2023-06-02 DIAGNOSIS — R87619 Unspecified abnormal cytological findings in specimens from cervix uteri: Secondary | ICD-10-CM | POA: Insufficient documentation

## 2023-06-02 DIAGNOSIS — A630 Anogenital (venereal) warts: Secondary | ICD-10-CM

## 2023-06-02 DIAGNOSIS — N750 Cyst of Bartholin's gland: Secondary | ICD-10-CM | POA: Diagnosis not present

## 2023-06-02 DIAGNOSIS — Z23 Encounter for immunization: Secondary | ICD-10-CM

## 2023-06-02 DIAGNOSIS — Z3202 Encounter for pregnancy test, result negative: Secondary | ICD-10-CM

## 2023-06-02 DIAGNOSIS — R87612 Low grade squamous intraepithelial lesion on cytologic smear of cervix (LGSIL): Secondary | ICD-10-CM | POA: Diagnosis not present

## 2023-06-02 DIAGNOSIS — R87810 Cervical high risk human papillomavirus (HPV) DNA test positive: Secondary | ICD-10-CM | POA: Diagnosis not present

## 2023-06-02 LAB — POCT URINE PREGNANCY: Preg Test, Ur: NEGATIVE

## 2023-06-02 NOTE — Progress Notes (Signed)
GYNECOLOGY OFFICE VISIT NOTE  History:   Stephanie Sosa is a 32 y.o. G2P0020 here today for bartholin cyst, vulvar warts and abnormal pap smear.   She has a history of vulvar warts.  She has had them removed with cryo in the past.  She would prefer this method today.  She also has a history of a Bartholin cyst.  She has had it I&D twice in the past but it returns.  It is on the right side.  She had one gardasil shot but did not complete the series.  She would like to complete it today.   The following portions of the patient's history were reviewed and updated as appropriate: allergies, current medications, past family history, past medical history, past social history, past surgical history and problem list.   Health Maintenance:   HPV pos, non 16/18.    Diagnosis  Date Value Ref Range Status  05/04/2023 - Low grade squamous intraepithelial lesion (LSIL) (A)  Corrected   Review of Systems:  Pertinent items noted in HPI and remainder of comprehensive ROS otherwise negative.  Physical Exam:  BP 132/88   Pulse 93   Ht 5\' 5"  (1.651 m)   Wt 184 lb (83.5 kg)   LMP 05/23/2023   BMI 30.62 kg/m  CONSTITUTIONAL: Well-developed, well-nourished female in no acute distress.  HEENT:  Normocephalic, atraumatic. External right and left ear normal. No scleral icterus.  NECK: Normal range of motion, supple, no masses noted on observation SKIN: No rash noted. Not diaphoretic. No erythema. No pallor. MUSCULOSKELETAL: Normal range of motion. No edema noted. NEUROLOGIC: Alert and oriented to person, place, and time. Normal muscle tone coordination. No cranial nerve deficit noted. PSYCHIATRIC: Normal mood and affect. Normal behavior. Normal judgment and thought content.   PELVIC: Normal appearing external genitalia except 3 small, flat vulvar warts and the right Bartholin cyst.  1 wart is on the internal aspect of the right labia majora and 2 are on the mons pubis.  The Bartholin cyst is about  2 to 3 cm in size and is fluctuant.  Normal urethral meatus; normal appearing vaginal mucosa and cervix.  No abnormal discharge noted.  Normal uterine size, no other palpable masses, no uterine or adnexal tenderness. Performed in the presence of a chaperone  Labs and Imaging Results for orders placed or performed in visit on 06/02/23 (from the past 168 hour(s))  POCT urine pregnancy   Collection Time: 06/02/23  4:00 PM  Result Value Ref Range   Preg Test, Ur Negative Negative     GYNECOLOGY OFFICE COLPOSCOPY PROCEDURE NOTE  32 y.o. [id number] here for colposcopy for low-grade squamous intraepithelial neoplasia (LGSIL - encompassing HPV,mild dysplasia,CIN I) pap smear on 04/2023. She was hpv pos, non 16/18..   Pregnancy test:  negative Gardasil:   Has not completed and accepts it today.  Discussed role for HPV in cervical dysplasia, need for surveillance.  Informed consent and review of risks, benefit and alternatives performed. Written consent given.   Speculum inserted into patient's vagina assuring full view of cervix and vaginal walls. 3 swabs of vinegar solution applied to the cervix and vaginal walls and colposcope was used to observe both the cervix and vaginal walls.   Colposcopy adequate? No -I could not see the full transformation zone  no visible lesions, no mosaicism, no punctation, and no abnormal vasculature;  ECC collected - sent to pathology.  The speculum was removed.  Attention was then turned to the vulvar warts.  Cryo was applied to each wart until the patient felt the pain associated with the cryo.  Each wart had multiple applications applied.  And then I repeated each round of multiple applications a second time.  The patient tolerated the procedure well.  Assessment and Plan:   1. Genital warts We will complete the HPV vaccine.  Cryo was completed today.  If incomplete response we would repeat the cryo.   2. Abnormal cervical Papanicolaou smear, unspecified abnormal  pap finding Colposcopy was normal in appearance.  We will await ECC for plan for follow-up but suspect will recommend Pap smear with HPV testing in 1 year. HPV vaccine given today.  - POCT urine pregnancy  3. Bartholin cyst We discussed the different options available which include incision and drainage again, marsupialization, and excision.  I would advise against excision as the next step given the risk of increased bleeding pain and injury to surrounding nerves.  We discussed marsupialization is typically done in the operating room with anesthesia.  If she would like incision and drainage again this is a reasonable option but we did discuss that the risk of recurrence would be higher given her history.  She would like to consider and then will let me know.  If she would like incision and drainage this can be done if Stephanie Sosa and I would recommend doing it with Dr. Berton Lan.   No orders of the defined types were placed in this encounter.    Routine preventative health maintenance measures emphasized. Please refer to After Visit Summary for other counseling recommendations.   Return in about 4 months (around 10/03/2023) for gardasil.  Milas Hock, MD, FACOG Obstetrician & Gynecologist, [facility] for [facility], [facility]

## 2023-10-04 ENCOUNTER — Ambulatory Visit: Payer: No Typology Code available for payment source

## 2024-02-03 ENCOUNTER — Ambulatory Visit: Admitting: Allergy & Immunology

## 2024-10-12 ENCOUNTER — Encounter (HOSPITAL_COMMUNITY): Payer: Self-pay | Admitting: *Deleted

## 2024-10-12 ENCOUNTER — Emergency Department (HOSPITAL_COMMUNITY)
Admission: EM | Admit: 2024-10-12 | Discharge: 2024-10-12 | Discharge status: Against Medical Advice | Payer: Self-pay | Attending: Emergency Medicine | Admitting: Emergency Medicine

## 2024-10-12 ENCOUNTER — Other Ambulatory Visit: Payer: Self-pay

## 2024-10-12 DIAGNOSIS — Z5321 Procedure and treatment not carried out due to patient leaving prior to being seen by health care provider: Secondary | ICD-10-CM | POA: Insufficient documentation

## 2024-10-12 DIAGNOSIS — N898 Other specified noninflammatory disorders of vagina: Secondary | ICD-10-CM | POA: Insufficient documentation

## 2024-10-12 NOTE — ED Notes (Signed)
 Called pt 3x for vitals, and received no response.

## 2024-10-12 NOTE — ED Triage Notes (Signed)
 POV/ ambulatory/ c/o vaginal itching,irritation, denies discharge/ x3 days
